# Patient Record
Sex: Female | Born: 1950 | Race: White | Hispanic: No | Marital: Married | Smoking: Never smoker
Health system: Southern US, Community
[De-identification: ages and names within clinical notes are randomized; demographics above are authoritative.]

## PROBLEM LIST (undated history)

## (undated) DIAGNOSIS — F419 Anxiety disorder, unspecified: Secondary | ICD-10-CM

## (undated) DIAGNOSIS — T7840XA Allergy, unspecified, initial encounter: Secondary | ICD-10-CM

## (undated) DIAGNOSIS — B351 Tinea unguium: Secondary | ICD-10-CM

## (undated) DIAGNOSIS — N951 Menopausal and female climacteric states: Secondary | ICD-10-CM

## (undated) DIAGNOSIS — I10 Essential (primary) hypertension: Secondary | ICD-10-CM

## (undated) HISTORY — DX: Allergy, unspecified, initial encounter: T78.40XA

## (undated) HISTORY — DX: Anxiety disorder, unspecified: F41.9

## (undated) HISTORY — DX: Menopausal and female climacteric states: N95.1

## (undated) HISTORY — PX: DILATION AND CURETTAGE OF UTERUS: SHX78

## (undated) HISTORY — PX: OTHER SURGICAL HISTORY: SHX169

## (undated) HISTORY — DX: Tinea unguium: B35.1

## (undated) HISTORY — DX: Essential (primary) hypertension: I10

## (undated) HISTORY — PX: EXPLORATORY LAPAROTOMY: SUR591

## (undated) HISTORY — PX: WISDOM TOOTH EXTRACTION: SHX21

---

## 2001-03-02 ENCOUNTER — Other Ambulatory Visit: Admission: RE | Admit: 2001-03-02 | Discharge: 2001-03-02 | Payer: Self-pay | Admitting: Obstetrics and Gynecology

## 2001-04-27 ENCOUNTER — Encounter (INDEPENDENT_AMBULATORY_CARE_PROVIDER_SITE_OTHER): Payer: Self-pay | Admitting: Specialist

## 2001-04-27 ENCOUNTER — Ambulatory Visit (HOSPITAL_COMMUNITY): Admission: RE | Admit: 2001-04-27 | Discharge: 2001-04-27 | Payer: Self-pay | Admitting: Obstetrics and Gynecology

## 2009-03-06 ENCOUNTER — Ambulatory Visit: Payer: Self-pay | Admitting: Family Medicine

## 2009-03-06 DIAGNOSIS — S838X9A Sprain of other specified parts of unspecified knee, initial encounter: Secondary | ICD-10-CM

## 2009-03-06 DIAGNOSIS — S86819A Strain of other muscle(s) and tendon(s) at lower leg level, unspecified leg, initial encounter: Secondary | ICD-10-CM

## 2009-03-16 ENCOUNTER — Ambulatory Visit: Payer: Self-pay | Admitting: Family Medicine

## 2009-03-16 DIAGNOSIS — R5383 Other fatigue: Secondary | ICD-10-CM

## 2009-03-16 DIAGNOSIS — IMO0001 Reserved for inherently not codable concepts without codable children: Secondary | ICD-10-CM

## 2009-03-16 DIAGNOSIS — I10 Essential (primary) hypertension: Secondary | ICD-10-CM | POA: Insufficient documentation

## 2009-03-16 DIAGNOSIS — R5381 Other malaise: Secondary | ICD-10-CM

## 2009-03-18 ENCOUNTER — Encounter (INDEPENDENT_AMBULATORY_CARE_PROVIDER_SITE_OTHER): Payer: Self-pay | Admitting: *Deleted

## 2009-03-18 LAB — CONVERTED CEMR LAB
Basophils Relative: 1 % (ref 0–1)
Eosinophils Absolute: 0.1 10*3/uL (ref 0.0–0.7)
MCHC: 31.8 g/dL (ref 30.0–36.0)
Monocytes Relative: 8 % (ref 3–12)
Neutro Abs: 4.3 10*3/uL (ref 1.7–7.7)
Neutrophils Relative %: 67 % (ref 43–77)
Platelets: 230 10*3/uL (ref 150–400)
RBC: 4.77 M/uL (ref 3.87–5.11)
Rhuematoid fact SerPl-aCnc: <20 intl units/mL (ref 0–20)
Sed Rate: 23 mm/hr — ABNORMAL HIGH (ref 0–22)
WBC: 6.5 10*3/uL (ref 4.0–10.5)

## 2009-08-10 ENCOUNTER — Ambulatory Visit: Payer: Self-pay | Admitting: Family Medicine

## 2009-08-10 ENCOUNTER — Other Ambulatory Visit: Admission: RE | Admit: 2009-08-10 | Discharge: 2009-08-10 | Payer: Self-pay | Admitting: Family Medicine

## 2009-08-10 DIAGNOSIS — Z780 Asymptomatic menopausal state: Secondary | ICD-10-CM | POA: Insufficient documentation

## 2009-08-10 LAB — CONVERTED CEMR LAB: Pap Smear: NORMAL

## 2010-01-01 ENCOUNTER — Ambulatory Visit: Payer: Self-pay | Admitting: Family Medicine

## 2010-01-01 DIAGNOSIS — B354 Tinea corporis: Secondary | ICD-10-CM | POA: Insufficient documentation

## 2010-02-23 NOTE — Assessment & Plan Note (Signed)
Summary: ringworm, HTN   Vital Signs:  Patient profile:   60 year old female Menstrual status:  postmenopausal Height:      69 inches Weight:      175 pounds BMI:     25.94 O2 Sat:      96 % on Room air Temp:     98.0 degrees F oral Pulse rate:   79 / minute BP sitting:   159 / 85  (left arm) Cuff size:   regular  Vitals Entered By: Payton Spark CMA (January 01, 2010 1:19 PM)  O2 Flow:  Room air CC: ? ring worm L shin x 10 days.   Primary Care Provider:  Seymour Bars DO  CC:  ? ring worm L shin x 10 days.Marland Kitchen  History of Present Illness: 60 yo WF presents for a rash over over the L anterior shin  10 days.  It is not itchy or painfull.  She tried some topical neosporin but it did not change. She has a dog.  o/w feels well.  Start on Metoprolol a year ago for HTN and social anxiety and it has really helped her social phobias.  Her BP has remained elevated though.  Denies CP, DOE or leg edema or palpitations.  Current Medications (verified): 1)  Metoprolol Succinate 50 Mg Xr24h-Tab (Metoprolol Succinate) .Marland Kitchen.. 1 Tab By Mouth Qam  Allergies (verified): No Known Drug Allergies  Past History:  Past Medical History: Reviewed history from 08/10/2009 and no changes required. menopause at 41   Past Surgical History: Reviewed history from 03/06/2009 and no changes required. bone spur D&C ex lap  Social History: Reviewed history from 03/06/2009 and no changes required. 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 Married to [place].  Daughter Yvonna Alanis.  Review of Systems      See HPI  Physical Exam  General:  alert, well-developed, well-nourished, and well-hydrated.   Skin:  1.6 annular pink scaley lesion over the L anterior lower leg with raised borders.     Impression & Recommendations:  Problem # 1:  TINEA CORPORIS (ICD-110.5) Assessment New Treat with topical Lamisil cream x 2 wks.   Call if not improving.  Disgard old  razor.  Problem # 2:  ESSENTIAL HYPERTENSION, BENIGN (ICD-401.1) Assessment: Deteriorated Add HCTZ to Metoprolol daily.  RTC in 3 mos and plan to update her BMP then. Her updated medication list for this problem includes:    Metoprolol Succinate 50 Mg Xr24h-tab (Metoprolol succinate) .Marland Kitchen... 1 tab by mouth qam    Hydrochlorothiazide 25 Mg Tabs (Hydrochlorothiazide) .Marland Kitchen... 1 tab by mouth qam  BP today: 159/85 Prior BP: 142/89 (08/10/2009)  Complete Medication List: 1)  Metoprolol Succinate 50 Mg Xr24h-tab (Metoprolol succinate) .Marland Kitchen.. 1 tab by mouth qam 2)  Terbinafine Hcl 1 % Crea (Terbinafine hcl) .... Apply to rash two times a day x 2 wks. 3)  Hydrochlorothiazide 25 Mg Tabs (Hydrochlorothiazide) .Marland Kitchen.. 1 tab by mouth qam  Patient Instructions: 1)  Stay on Metoprolol daily and add HCTZ 25 mg once daily for BP reduction. 2)  Use Terbinafine topical on ringworm rash. 3)  Return for f/u BP in 3 mos. Prescriptions: HYDROCHLOROTHIAZIDE 25 MG TABS (HYDROCHLOROTHIAZIDE) 1 tab by mouth qAM  #30 x 2   Entered and Authorized by:   Seymour Bars DO   Signed by:   Seymour Bars DO on 01/01/2010   Method used:   Electronically to  [facility] [address] [phone]* (retail)       [address]       [place], [place]  [address]       Ph: [phone] or [phone]       Fax: [phone]   RxID:   [id number] TERBINAFINE HCL 1 % CREA (TERBINAFINE HCL) apply to rash two times a day x 2 wks.  #1 tube x 2   Entered and Authorized by:   Seymour Bars DO   Signed by:   Seymour Bars DO on 01/01/2010   Method used:   Electronically to        [facility]. [address] [phone]* (retail)       [address]       [place], [place]  [address]       Ph: [phone] or [phone]       Fax: [phone]   RxID:   [id number]    Orders Added: 1)  Est. Patient Level IV [706[address]]

## 2010-02-23 NOTE — Letter (Signed)
Summary: Primary Care Consult Scheduled Letter  [place] [facility]  [address]   [place], [place] [address]   Phone: [phone]  Fax: [phone]      03/18/2009 MRN: [id number]  [facility] [address] [place], [place]  [address]    Dear Ms. Kuhl,      We have scheduled an appointment for you.  At the recommendation of Dr.BOWEN , we have scheduled you a consult with DR Ancil Linsey ,RHEUMATOLOGY  on JULY 12,2011 at 3:30PM .  Their address is_[address], [place]   [address]. The office phone number is [phone] .  If this appointment day and time is not convenient for you, please feel free to call the office of the doctor you are being referred to at the number listed above and reschedule the appointment.     It is important for you to keep your scheduled appointments. We are here to make sure you are given good patient care. If you have questions or you have made changes to your appointment, please notify us at  [phone], ask for HELEN.    Thank you,  Patient Care Coordinator [place] [facility]

## 2010-02-23 NOTE — Assessment & Plan Note (Signed)
Summary: CPE with pap   Vital Signs:  Patient profile:   60 year old female Menstrual status:  postmenopausal Height:      69 inches Weight:      173 pounds BMI:     25.64 O2 Sat:      97 % on Room air Pulse rate:   60 / minute BP sitting:   142 / 89  (left arm) Cuff size:   regular  Vitals Entered By: Payton Spark CMA (August 10, 2009 4:14 PM)  O2 Flow:  Room air CC: CPE w/ pap     Menstrual Status postmenopausal   Primary Care Provider:  Seymour Bars DO  CC:  CPE w/ pap.  History of Present Illness: 60 yo WF presents for CPE with pap smear.  She is doing well.  Due for pap smear, mammogram, DEXA scan.  She is due for her Tetanus shot and fasting labs.  She has continued to put off her initial screening colonoscopy.  Postmenopausal x 7 yrs, not on HRT.  Monogamous with husband.  Feels well.  BP has been well controlled on Metoprolol.  Due for RF.  Denies abnormal pap smears in the past.  Denies postmenopausal bleeding.  Denies fam hx of premature heart dz.  Current Medications (verified): 1)  Metoprolol Succinate 50 Mg Xr24h-Tab (Metoprolol Succinate) .Marland Kitchen.. 1 Tab By Mouth Qam  Allergies (verified): No Known Drug Allergies  Past History:  Past Medical History: menopause at 80   Past Surgical History: Reviewed history from 03/06/2009 and no changes required. bone spur D&C ex lap  Family History: Reviewed history from 03/06/2009 and no changes required. father died in his 75s from lung cancer  mother died of COPD brother died at 44 from colon cancer sister healthy  Social History: Reviewed history from 03/06/2009 and no changes required. 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 Married to [place].  Daughter Yvonna Alanis.  Review of Systems  The patient denies anorexia, fever, weight loss, weight gain, vision loss, decreased hearing, hoarseness, chest pain, syncope, dyspnea on exertion, peripheral edema, prolonged cough,  headaches, hemoptysis, abdominal pain, melena, hematochezia, severe indigestion/heartburn, hematuria, incontinence, genital sores, muscle weakness, suspicious skin lesions, transient blindness, difficulty walking, depression, unusual weight change, abnormal bleeding, enlarged lymph nodes, angioedema, breast masses, and testicular masses.    Physical Exam  General:  alert, well-developed, well-nourished, and well-hydrated.   Head:  normocephalic and atraumatic.   Eyes:  pupils equal, pupils round, and pupils reactive to light.   Ears:  EACs patent; TMs translucent and gray with good cone of light and bony landmarks.  Nose:  no nasal discharge.   Mouth:  good dentition and pharynx pink and moist.   Neck:  no masses.   Breasts:  No mass, nodules, thickening, tenderness, bulging, retraction, inflamation, nipple discharge or skin changes noted.   Lungs:  Normal respiratory effort, chest expands symmetrically. Lungs are clear to auscultation, no crackles or wheezes. Heart:  Normal rate and regular rhythm. S1 and S2 normal without gallop, murmur, click, rub or other extra sounds. Abdomen:  Bowel sounds positive,abdomen soft and non-tender without masses, organomegaly or hernias noted. Genitalia:  Pelvic Exam:        External: normal female genitalia without lesions or masses        Vagina: normal without lesions or masses        Cervix: normal without lesions or masses  Adnexa: normal bimanual exam without masses or fullness        Uterus: normal by palpation        Pap smear: performed Pulses:  2+ radial and pedal pulses Extremities:  no E/C/C Skin:  color normal.   Cervical Nodes:  No lymphadenopathy noted Psych:  good eye contact, not anxious appearing, and not depressed appearing.     Impression & Recommendations:  Problem # 1:  ROUTINE GYNECOLOGICAL EXAMINATION (ICD-V72.31) Thin prep pap smear done.   Update mammogram and DEXA scan. Update fasting labs and Tdap. RF BP  meds. Work on diet, exercise, MVI and calcium/ D daily. Declined colonoscopy, will call when ready to schedule.  Complete Medication List: 1)  Metoprolol Succinate 50 Mg Xr24h-tab (Metoprolol succinate) .Marland Kitchen.. 1 tab by mouth qam  Other Orders: T-Comprehensive Metabolic Panel [phone]) T-Lipid Profile [phone]) T-Mammography Bilateral Screening ([address]) T-DXA Bone Density/ Appendicular (13086) T-Dual DXA Bone Density/ Axial (57846) Tdap => 70yrs IM ([address]) Admin 1st Vaccine (284[address]) Admin 1st Vaccine [facility]) [phone])  Patient Instructions: 1)  Return for a nurse BP check in 4 wks. Prescriptions: METOPROLOL SUCCINATE 50 MG XR24H-TAB (METOPROLOL SUCCINATE) 1 tab by mouth qAM  #30 x 5   Entered and Authorized by:   Seymour Bars DO   Signed by:   Seymour Bars DO on 08/10/2009   Method used:   Electronically to        [facility]. [address] [phone]* (retail)       [address]       [place], [place]  [address]       Ph: [phone] or [phone]       Fax: [phone]   RxID:   [id number]    Tetanus/Td Vaccine    Vaccine Type: Tdap    Site: right deltoid    Dose: 0.5 ml    Route: IM    Given by: Payton Spark CMA    Exp. Date: 04/18/2011    Lot #: [id number]    VIS given: 12/12/06 version given August 10, 2009.

## 2010-02-23 NOTE — Assessment & Plan Note (Signed)
Summary: NOV hamstring strain   Vital Signs:  Patient profile:   60 year old female Height:      69 inches Weight:      166 pounds BMI:     24.60 O2 Sat:      97 % on Room air Temp:     98.4 degrees F oral Pulse rate:   89 / minute BP sitting:   163 / 88  (right arm) Cuff size:   regular  Vitals Entered By: Payton Spark CMA/April (March 06, 2009 3:05 PM)  O2 Flow:  Room air CC: c/o pulled right hamstring    Primary Care Provider:  Seymour Bars DO  CC:  c/o pulled right hamstring .  History of Present Illness: 60 yo WF presents for pain in the R hamstring and buttocks that started about 7 wks ago.  Denies any trauma or overuse injury.  No hx of pain in this region.  Rated as moderate intensity.  She went to [facility] 2 wks ago.  She took Amrix x 6 days.  She has been taking Aleve and using [facility] which helps.  She sits mostly at work.  Her pain is better with sitting.  Pain is worse going up and down stairs and worse with lifting esp on the R side.    She is able to get comfortable to sleep.  She is doing a light workout but usually does the elliptical.  Pain radiates to the knees.  No weakness or paresthesias.  No LBP.  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 Married to [place].  Daughter Yvonna Alanis.  Review of Systems       no fevers/sweats/weakness, unexplained wt loss/gain, no change in vision, no difficulty hearing, ringing in ears, no hay fever/allergies, no CP/discomfort, no palpitations, no breast lump/nipple discharge, no cough/wheeze, no blood in stool, no N/V/D, no nocturia, no leaking  urine, no unusual vag bleeding, no vaginal/penile discharge, no muscle/joint pain, no rash, no new/changing mole, no HA, no memory loss, no anxiety, no sleep problem, no depression, no unexplained lumps, no easy bruising/bleeding, no concern with sexual function   Physical Exam  General:  alert, well-developed, well-nourished, and well-hydrated.   Head:  normocephalic and atraumatic.   Mouth:  good dentition and pharynx pink and moist.   Neck:  supple, full ROM, and no masses.   Lungs:  Normal respiratory effort, chest expands symmetrically. Lungs are clear to auscultation, no crackles or wheezes. Heart:  Normal rate and regular rhythm. S1 and S2 normal without gallop, murmur, click, rub or other extra sounds. Abdomen:  soft and non-tender.   Msk:  full active L spine ROM normal heel toe walk tender over the R SI notch and piriformis.  full resisted strength over both hamstrings with R>L tightness. No muscle defects. Pulses:  2+ pedal pulses Extremities:  no LE edema Neurologic:  gait normal and DTRs symmetrical and normal.   Skin:  color normal.   Psych:  good eye contact, not anxious appearing, and not depressed appearing.     Impression & Recommendations:  Problem # 1:  MUSCLE STRAIN, HAMSTRING MUSCLE (ICD-844.8) 7 wks of hamstring pain due to muscle strain with possible sacroilitis also. Start formal PT.  Use Mobic as RX NSAID.  Use heat/ icy hot. RTC to recheck with CPE in 1 month.  Needs colonoscopy. Orders: Physical Therapy Referral (PT)  Complete Medication List: 1)  Meloxicam 7.5 Mg Tabs (Meloxicam) .Marland Kitchen.. 1-2 tab by mouth daily with food as needed for hamstring pain  Patient Instructions: 1)  Take Meloxicam instead of Aleve as your anti inflammatory. 2)  Will set you up for PT down the hall. 3)  ICY hot/ heating pad fine to use. 4)  Return for CPE with pap smear in 1 month. Prescriptions: MELOXICAM 7.5 MG TABS (MELOXICAM) 1-2 tab by mouth daily with food as needed for  hamstring pain  #60 x 0   Entered and Authorized by:   Seymour Bars DO   Signed by:   Seymour Bars DO on 03/06/2009   Method used:   Electronically to        [facility]. [address] [phone]* (retail)       [address]       [place], [place]  [address]       Ph: [phone] or [phone]       Fax: [phone]   RxID:   [id number]

## 2010-02-23 NOTE — Assessment & Plan Note (Signed)
Summary: myalgias   Vital Signs:  Patient profile:   60 year old female Height:      69 inches Weight:      165 pounds BMI:     24.45 O2 Sat:      97 % on Room air Temp:     99.0 degrees F oral Pulse rate:   84 / minute BP sitting:   191 / 96  (left arm) Cuff size:   regular  Vitals Entered By: Payton Spark CMA (March 16, 2009 1:28 PM)  O2 Flow:  Room air CC: Muscle aches in arms and legs. Also c/o fatigue   Primary Care Provider:  Seymour Bars DO  CC:  Muscle aches in arms and legs. Also c/o fatigue.  History of Present Illness: 60 yo WF presents for problems with muscle aches in the arms and legs x 1 wk.  'Feels like I have the flu' but denies any sore throat, cough, congestion or GI upset, fevers or chills.  Getting worse.  Worse in the morning.  No hx of recent illness.  In October 2010, she was sick with nausea and dizziness.   That cleared up and she had no myalgias.  She is off all RX and OTC meds.  She is still going to work.  She denies feeling tired.  Denies joint pains.  She is sleeping well at night.    No hx of statin use.  Denies feeling anxious or depressed.  Denies any acute stresors.    Current Medications (verified): 1)  Meloxicam 7.5 Mg Tabs (Meloxicam) .Marland Kitchen.. 1-2 Tab By Mouth Daily With Food As Needed For Hamstring Pain  Allergies (verified): No Known Drug Allergies  Past History:  Past Medical History: Reviewed history from 03/06/2009 and no changes required. none  Past Surgical History: Reviewed history from 03/06/2009 and no changes required. bone spur D&C ex lap  Family History: Reviewed history from 03/06/2009 and no changes required. 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 Married to [place].  Daughter  Yvonna Alanis.  Review of Systems      See HPI  Physical Exam  General:  alert, well-developed, well-nourished, and well-hydrated.   Head:  normocephalic and atraumatic.   Eyes:  conjunctiva clear; wears glasses Nose:  no nasal discharge.   Mouth:  good dentition and pharynx pink and moist.   Neck:  no masses.   Lungs:  Normal respiratory effort, chest expands symmetrically. Lungs are clear to auscultation, no crackles or wheezes. Heart:  Normal rate and regular rhythm. S1 and S2 normal without gallop, murmur, click, rub or other extra sounds. Abdomen:  soft, non-tender, no distention, and no guarding.   Msk:  no joint tenderness, no joint swelling, no joint warmth, and no redness over joints.  slightly tender over the upper arms and thighs - right in the middle of the muscle belly mild synovitis over the DIP joints, both hands Extremities:  no E/C/C Skin:  color normal and no rashes.   Cervical Nodes:  No lymphadenopathy noted Psych:  good eye contact, not depressed appearing, and slightly anxious.     Impression & Recommendations:  Problem # 1:  MYALGIA (ICD-729.1) Check labs today to look for underlying cause of her myalgias.  We discussed  the possiblity of fibromyalgia.  Will try her on Flexeril at bedtime and f/u lab tomorrow.   The following medications were removed from the medication list:    Meloxicam 7.5 Mg Tabs (Meloxicam) .Marland Kitchen... 1-2 tab by mouth daily with food as needed for hamstring pain Her updated medication list for this problem includes:    Flexeril 5 Mg Tabs (Cyclobenzaprine hcl) .Marland Kitchen... 1 tab by mouth at bedtime as needed muscle aches  Orders: T-CK Total [phone]) T-CBC w/Diff ([id number]) T-Rheumatoid Factor [id number]) T-Sed Rate (Automated) ([address]41-32440) T-Antinuclear Antib (ANA) ([id number])  Problem # 2:  ESSENTIAL HYPERTENSION, BENIGN (ICD-401.1) Assessment: New BP very high today.  Will start a BB for BP reduction and related social anxiety.  F/U in  1 month. Her updated medication list for this problem includes:    Metoprolol Succinate 50 Mg Xr24h-tab (Metoprolol succinate) .Marland Kitchen... 1 tab by mouth qam  BP today: 191/96 Prior BP: 163/88 (03/06/2009)  Complete Medication List: 1)  Metoprolol Succinate 50 Mg Xr24h-tab (Metoprolol succinate) .Marland Kitchen.. 1 tab by mouth qam 2)  Flexeril 5 Mg Tabs (Cyclobenzaprine hcl) .Marland Kitchen.. 1 tab by mouth at bedtime as needed muscle aches  Other Orders: T-TSH ([id number])  Patient Instructions: 1)  Labs today downstairs. 2)  Will call you w/ results tomorrow.   3)  Start on Metroprolol 50 mg every AM for high BP. 4)  Start Flexeril at bedtime for myalgias. 5)  Return for f/u in 3 wks. Prescriptions: FLEXERIL 5 MG TABS (CYCLOBENZAPRINE HCL) 1 tab by mouth at bedtime as needed muscle aches  #30 x 0   Entered and Authorized by:   Seymour Bars DO   Signed by:   Seymour Bars DO on 03/16/2009   Method used:   Electronically to        [facility]. [address] [phone]* (retail)       [address]       [place], [place]  [address]       Ph: [phone] or [phone]       Fax: [phone]   RxID:   [id number] METOPROLOL SUCCINATE 50 MG XR24H-TAB (METOPROLOL SUCCINATE) 1 tab by mouth qAM  #30 x 2   Entered and Authorized by:   Seymour Bars DO   Signed by:   Seymour Bars DO on 03/16/2009   Method used:   Electronically to        [facility]. [address] [phone]* (retail)       [address]       [place], [place]  [address]       Ph: [phone] or [phone]       Fax: [phone]   RxID:   [id number]

## 2010-03-10 ENCOUNTER — Telehealth (INDEPENDENT_AMBULATORY_CARE_PROVIDER_SITE_OTHER): Payer: Self-pay | Admitting: *Deleted

## 2010-03-17 NOTE — Progress Notes (Signed)
Summary: [facility]-Metoprolol Refill  Phone Note Call from Patient Call back at Work Phone [phone]   Caller: Patient Call For: Seymour Bars DO Reason for Call: Refill Medication Summary of Call: needs refill on Metoprolol, would like it to be sent to [facility] [place]. Initial call taken by: Francee Piccolo CMA Duncan Dull),  March 10, 2010 2:49 PM    Prescriptions: METOPROLOL SUCCINATE 50 MG XR24H-TAB (METOPROLOL SUCCINATE) 1 tab by mouth qAM  #30 x 2   Entered by:   Payton Spark CMA   Authorized by:   Seymour Bars DO   Signed by:   Payton Spark CMA on 03/10/2010   Method used:   [facility] [address] [phone]* (retail)       [address]       [place], [place]  [address]       Ph: [phone] or [phone]       Fax: [phone]   RxID:   [id number]

## 2010-04-19 ENCOUNTER — Other Ambulatory Visit: Payer: Self-pay | Admitting: *Deleted

## 2010-04-19 MED ORDER — HYDROCHLOROTHIAZIDE 25 MG PO TABS: 25.0000 mg | ORAL_TABLET | Freq: Every day | ORAL | Status: DC

## 2010-06-03 ENCOUNTER — Other Ambulatory Visit: Payer: Self-pay | Admitting: *Deleted

## 2010-06-03 MED ORDER — METOPROLOL SUCCINATE ER 50 MG PO TB24: 50.0000 mg | ORAL_TABLET | Freq: Every day | ORAL | Status: DC

## 2010-06-11 ENCOUNTER — Other Ambulatory Visit: Payer: Self-pay | Admitting: Family Medicine

## 2010-06-11 NOTE — Op Note (Signed)
[facility]  Patient:    Stephanie Cross, Stephanie Cross Visit Number: [id number] MRN: [id number]          Service Type: DSU Location: DAY Attending Physician:  Michaele Offer Dictated by:   Zenaida Niece, M.D. Proc. Date: 04/27/01 Admit Date:  04/27/2001                             Operative Report  PREOPERATIVE DIAGNOSIS:  Abnormal uterine bleeding.  POSTOPERATIVE DIAGNOSIS:  Abnormal uterine bleeding.  PROCEDURE:  Hysteroscopy with dilation and curettage.  SURGEON:  Zenaida Niece, M.D.  ANESTHESIA:  Monitored anesthesia care and paracervical block.  ESTIMATED BLOOD LOSS:  Less than 50 cc. Deficit through the hysteroscope was approximately 60 cc.  FINDINGS:  Small endometrial cavity with a small amount of tissue at the left uterine cornu. The endometrium was otherwise atrophic without significant polyp or leiomyoma.  DESCRIPTION OF PROCEDURE:  The patient was taken to the operating room and placed in the dorsal supine position. She was given IV sedation and then placed in mobile stirrups. The perineum and vagina were prepped and draped in the usual sterile fashion and her bladder drained with a red rubber catheter. A bivalve speculum was inserted into the vagina and the anterior lip of the cervix grasped with a single tooth tenaculum. A paracervical block was then performed with 2% lidocaine. The uterus then sounded to approximately 7-8 cm. The cervix was gradually dilated to a size 21 dilator with moderate resistance. The observer scope was introduced and inspection revealed the above mentioned findings. The only significant finding was a small amount of tissue at the left cornu. The scope was removed and sharp curettage was performed trying to loosen the tissue in the left cornu. Polyp forceps were used to try and remove this tissue. Observation with the hysteroscope then revealed no significant bleeding, still no lesions and a small  amount of tissue remaining in the uterus. The scope was removed and polyp forceps and curette were used try and remove the remainder of this tissue. The single tooth tenaculum was then removed and bleeding controlled with pressure. All instruments were then removed from the vagina. The patient was awakened in the operating room and tolerated the procedure well and taken to the recovery room in stable condition. Dictated by:   Zenaida Niece, M.D. Attending Physician:  Michaele Offer DD:  04/27/01 TD:  04/27/01 Job: [id number]

## 2010-07-07 ENCOUNTER — Other Ambulatory Visit: Payer: Self-pay | Admitting: *Deleted

## 2010-07-07 MED ORDER — HYDROCHLOROTHIAZIDE 25 MG PO TABS: 25.0000 mg | ORAL_TABLET | Freq: Every day | ORAL | Status: DC

## 2010-11-22 ENCOUNTER — Other Ambulatory Visit: Payer: Self-pay | Admitting: *Deleted

## 2010-11-22 MED ORDER — HYDROCHLOROTHIAZIDE 25 MG PO TABS: 25.0000 mg | ORAL_TABLET | Freq: Every day | ORAL | Status: DC

## 2010-11-25 ENCOUNTER — Encounter: Payer: Self-pay | Admitting: Family Medicine

## 2010-12-03 ENCOUNTER — Ambulatory Visit: Payer: Self-pay | Admitting: Family Medicine

## 2010-12-14 ENCOUNTER — Encounter: Payer: Self-pay | Admitting: Family Medicine

## 2010-12-14 ENCOUNTER — Ambulatory Visit (INDEPENDENT_AMBULATORY_CARE_PROVIDER_SITE_OTHER): Payer: BC Managed Care – PPO | Admitting: Family Medicine

## 2010-12-14 VITALS — BP 137/74 | HR 93 | Wt 173.0 lb

## 2010-12-14 DIAGNOSIS — I10 Essential (primary) hypertension: Secondary | ICD-10-CM

## 2010-12-14 DIAGNOSIS — J329 Chronic sinusitis, unspecified: Secondary | ICD-10-CM

## 2010-12-14 DIAGNOSIS — Z1211 Encounter for screening for malignant neoplasm of colon: Secondary | ICD-10-CM

## 2010-12-14 MED ORDER — HYDROCHLOROTHIAZIDE 25 MG PO TABS: 25.0000 mg | ORAL_TABLET | Freq: Every day | ORAL | Status: DC

## 2010-12-14 MED ORDER — ALBUTEROL SULFATE HFA 108 (90 BASE) MCG/ACT IN AERS: 2.0000 | INHALATION_SPRAY | Freq: Four times a day (QID) | RESPIRATORY_TRACT | Status: DC | PRN

## 2010-12-14 MED ORDER — METOPROLOL SUCCINATE ER 50 MG PO TB24: 50.0000 mg | ORAL_TABLET | Freq: Every day | ORAL | Status: DC

## 2010-12-14 MED ORDER — AMOXICILLIN 875 MG PO TABS: 875.0000 mg | ORAL_TABLET | Freq: Two times a day (BID) | ORAL | Status: AC

## 2010-12-14 NOTE — Patient Instructions (Signed)
Call if not better in one week.  We will call you with your lab results. If you don't here from [place] in about a week then please give [place] a call at [phone].

## 2010-12-14 NOTE — Progress Notes (Signed)
  Subjective:    Patient ID: Stephanie Cross, female    DOB: 09-Nov-1950, 60 y.o.   MRN: [id number]  Hypertension This is a chronic problem. The current episode started more than 1 year ago. The problem is controlled. Pertinent negatives include no blurred vision, chest pain, headaches or shortness of breath. There are no associated agents to hypertension. Past treatments include diuretics and beta blockers. The current treatment provides moderate improvement. There are no compliance problems.    Cough and postnasal drip x 2 weeks. Has been using her inhaler.  Needs RF. Some dry and some production.  No fever. Started with mild ST last week and htat is better. Now some mild nasal congestion.    Review of Systems  Eyes: Negative for blurred vision.  Respiratory: Negative for shortness of breath.   Cardiovascular: Negative for chest pain.  Neurological: Negative for headaches.       Objective:   Physical Exam  Constitutional: She is oriented to person, place, and time. She appears well-developed and well-nourished.  HENT:  Head: Normocephalic and atraumatic.  Neck: Neck supple. No thyromegaly present.  Cardiovascular: Normal rate, regular rhythm and normal heart sounds.        No carotid bruits  Pulmonary/Chest: Effort normal and breath sounds normal.  Lymphadenopathy:    She has no cervical adenopathy.  Neurological: She is alert and oriented to person, place, and time.  Skin: Skin is warm and dry.  Psychiatric: She has a normal mood and affect. Her behavior is normal.          Assessment & Plan:  HTN -  she she is well controlled. I will refill her medications. Followup in 6 months. She is also due for CMP and lipid panel. Lab slips were given today. It has been a year since her last set of blood work.  Sinusitis - I still think this could be viral. I encouraged her to get a couple more days to see if she improves. If she suddenly feels worse or is not improving by the weekend  she can go ahead and fill the prescription for amoxicillin. If she does so prescription and she's not better in one week then please call the office. She can continue over-the-counter symptomatic care.  We also had discussion about the importance of screening for colon cancer. Patient has never had a screening colonoscopy and she is 23. She agreed to get her scheduled and I encouraged her to keep the appointment. She is also due for a screening mammogram but wanted to hold off on this until in a year.

## 2011-02-15 LAB — LIPID PANEL: HDL: 52 mg/dL (ref >39)

## 2011-02-15 LAB — COMPLETE METABOLIC PANEL WITH GFR
Albumin: 4.6 g/dL (ref 3.5–5.2)
CO2: 28 mEq/L (ref 19–32)
Calcium: 9.4 mg/dL (ref 8.4–10.5)
GFR, Est African American: 66 mL/min
GFR, Est Non African American: 58 mL/min — ABNORMAL LOW
Glucose, Bld: 96 mg/dL (ref 70–99)
Sodium: 143 mEq/L (ref 135–145)
Total Bilirubin: 0.6 mg/dL (ref 0.3–1.2)
Total Protein: 6.6 g/dL (ref 6.0–8.3)

## 2011-03-10 ENCOUNTER — Encounter: Payer: Self-pay | Admitting: Gastroenterology

## 2011-06-14 ENCOUNTER — Other Ambulatory Visit: Payer: Self-pay | Admitting: Family Medicine

## 2011-09-09 ENCOUNTER — Other Ambulatory Visit: Payer: Self-pay | Admitting: Family Medicine

## 2011-12-14 ENCOUNTER — Other Ambulatory Visit: Payer: Self-pay | Admitting: Family Medicine

## 2011-12-14 NOTE — Telephone Encounter (Signed)
Must make appt before any further refills. 

## 2012-01-12 ENCOUNTER — Other Ambulatory Visit: Payer: Self-pay | Admitting: *Deleted

## 2012-01-12 MED ORDER — HYDROCHLOROTHIAZIDE 25 MG PO TABS: 25.0000 mg | ORAL_TABLET | Freq: Every day | ORAL | Status: DC

## 2012-01-12 MED ORDER — METOPROLOL SUCCINATE ER 50 MG PO TB24: 50.0000 mg | ORAL_TABLET | Freq: Every day | ORAL | Status: DC

## 2012-01-13 ENCOUNTER — Ambulatory Visit: Payer: BC Managed Care – PPO | Admitting: Family Medicine

## 2012-01-24 ENCOUNTER — Ambulatory Visit (INDEPENDENT_AMBULATORY_CARE_PROVIDER_SITE_OTHER): Payer: BC Managed Care – PPO | Admitting: Family Medicine

## 2012-01-24 ENCOUNTER — Encounter: Payer: Self-pay | Admitting: Family Medicine

## 2012-01-24 VITALS — BP 149/76 | HR 79 | Resp 14 | Ht 68.0 in | Wt 171.0 lb

## 2012-01-24 DIAGNOSIS — Z1231 Encounter for screening mammogram for malignant neoplasm of breast: Secondary | ICD-10-CM

## 2012-01-24 DIAGNOSIS — I10 Essential (primary) hypertension: Secondary | ICD-10-CM

## 2012-01-24 DIAGNOSIS — Z800 Family history of malignant neoplasm of digestive organs: Secondary | ICD-10-CM

## 2012-01-24 DIAGNOSIS — J45909 Unspecified asthma, uncomplicated: Secondary | ICD-10-CM | POA: Insufficient documentation

## 2012-01-24 DIAGNOSIS — J069 Acute upper respiratory infection, unspecified: Secondary | ICD-10-CM

## 2012-01-24 MED ORDER — METOPROLOL SUCCINATE ER 100 MG PO TB24: 100.0000 mg | ORAL_TABLET | Freq: Every day | ORAL | Status: DC

## 2012-01-24 MED ORDER — HYDROCHLOROTHIAZIDE 25 MG PO TABS: 25.0000 mg | ORAL_TABLET | Freq: Every day | ORAL | Status: DC

## 2012-01-24 MED ORDER — ALBUTEROL SULFATE HFA 108 (90 BASE) MCG/ACT IN AERS: 2.0000 | INHALATION_SPRAY | Freq: Four times a day (QID) | RESPIRATORY_TRACT | Status: DC | PRN

## 2012-01-24 NOTE — Progress Notes (Signed)
Subjective:    Patient ID: Stephanie Cross, female    DOB: 13-Feb-1950, 61 y.o.   MRN: [id number]  HPI HTN -  Pt denies chest pain, SOB, dizziness, or heart palpitations.  Taking meds as directed w/o problems.  Denies medication side effects.    Cold sxs for about 3 days ago.  Has been taking some OTC meds.  Coughing started yesterday. No fever. No GI sxs.  No SOB.  Has been using her inhaler some during the cold. Some mild nasal congestion this AM.    Review of Systems     BP 149/76  Pulse 79  Resp 14  Ht 5\' 8"  (1.727 m)  Wt 171 lb (77.565 kg)  BMI 26.00 kg/m2  SpO2 98%    No Known Allergies  Past Medical History  Diagnosis Date  . Menopause syndrome     Past Surgical History  Procedure Date  . Bone spur   . Dilation and curettage of uterus   . Exploratory laparotomy     History   Social History  . Marital Status: Single    Spouse Name: N/A    Number of Children: N/A  . Years of Education: N/A   Occupational History  . Not on file.   Social History Main Topics  . Smoking status: Never Smoker   . Smokeless tobacco: Not on file  . Alcohol Use: Yes     Comment: occasiona;  Marland Kitchen Drug Use:   . Sexually Active:      Comment: admin asst, walks on treadmill 4 X week, married.   Other Topics Concern  . Not on file   Social History Narrative  . No narrative on file    Family History  Problem Relation Age of Onset  . COPD Mother     smoker  . Lung cancer Father     smoker   . Colon cancer Brother     Outpatient Encounter Prescriptions as of 01/24/2012  Medication Sig Dispense Refill  . albuterol (PROAIR HFA) 108 (90 BASE) MCG/ACT inhaler Inhale 2 puffs into the lungs every 6 (six) hours as needed for wheezing or shortness of breath.  3 Inhaler  2  . hydrochlorothiazide (HYDRODIURIL) 25 MG tablet Take 1 tablet (25 mg total) by mouth daily.  90 tablet  1  . metoprolol succinate (TOPROL-XL) 100 MG 24 hr tablet Take 1 tablet (100 mg total) by mouth daily. Take  with or immediately following a meal.  90 tablet  0  . [DISCONTINUED] albuterol (PROAIR HFA) 108 (90 BASE) MCG/ACT inhaler Inhale 2 puffs into the lungs every 6 (six) hours as needed.  3 Inhaler  2  . [DISCONTINUED] hydrochlorothiazide (HYDRODIURIL) 25 MG tablet Take 1 tablet (25 mg total) by mouth daily.  20 tablet  0  . [DISCONTINUED] metoprolol succinate (TOPROL-XL) 50 MG 24 hr tablet Take 1 tablet (50 mg total) by mouth daily. Take with or immediately following a meal.  20 tablet  0       Objective:   Physical Exam  Constitutional: She is oriented to person, place, and time. She appears well-developed and well-nourished.  HENT:  Head: Normocephalic and atraumatic.  Right Ear: External ear normal.  Left Ear: External ear normal.  Nose: Nose normal.  Mouth/Throat: Oropharynx is clear and moist.       TMs and canals are clear.   Eyes: Conjunctivae normal and EOM are normal. Pupils are equal, round, and reactive to light.  Neck: Neck supple. No thyromegaly  present.  Cardiovascular: Normal rate, regular rhythm and normal heart sounds.   Pulmonary/Chest: Effort normal and breath sounds normal. She has no wheezes.  Lymphadenopathy:    She has no cervical adenopathy.  Neurological: She is alert and oriented to person, place, and time.  Skin: Skin is warm and dry.  Psychiatric: She has a normal mood and affect.          Assessment & Plan:  HTN - uncontrolle.d Increase metoprolol to 100mg . F/U in 3 months for repeat BP check.    URI - Cal if not better in one week.  Symptomatic care.  Discussed need for shingles vaccine. Handout given. She will think about it.  She is overdue for screening mammogram. Order placed today.  She has a positive family history of colon cancer we discussed the need for screening colonoscopy. She says she's nervous to do it. I discussed how important it is an outcome as a preventative if they're able to remove precancerous polyps. She says she will  schedule. I gave her the information for digestive health.  Asthma -doing well overall. Rarely uses her inhaler but would like a new prescription because her old ones are getting to expire.

## 2012-03-14 ENCOUNTER — Ambulatory Visit: Payer: BC Managed Care – PPO | Admitting: Family Medicine

## 2012-03-27 ENCOUNTER — Encounter: Payer: Self-pay | Admitting: Internal Medicine

## 2012-03-28 ENCOUNTER — Ambulatory Visit (INDEPENDENT_AMBULATORY_CARE_PROVIDER_SITE_OTHER): Payer: BC Managed Care – PPO

## 2012-03-28 ENCOUNTER — Ambulatory Visit (INDEPENDENT_AMBULATORY_CARE_PROVIDER_SITE_OTHER): Payer: BC Managed Care – PPO | Admitting: Family Medicine

## 2012-03-28 ENCOUNTER — Other Ambulatory Visit: Payer: Self-pay | Admitting: Family Medicine

## 2012-03-28 ENCOUNTER — Encounter: Payer: Self-pay | Admitting: Family Medicine

## 2012-03-28 VITALS — BP 134/74 | HR 69 | Ht 68.0 in | Wt 175.0 lb

## 2012-03-28 DIAGNOSIS — R05 Cough: Secondary | ICD-10-CM

## 2012-03-28 DIAGNOSIS — I10 Essential (primary) hypertension: Secondary | ICD-10-CM

## 2012-03-28 DIAGNOSIS — R059 Cough, unspecified: Secondary | ICD-10-CM

## 2012-03-28 NOTE — Progress Notes (Signed)
Subjective:    Patient ID: Stephanie Cross, female    DOB: 09-May-1950, 62 y.o.   MRN: [id number]  HPI Cough -= x 1 month pt has hx of bronchitis. she stated that she feels that its in her back and she coughs up greenish yellow phlem. she has used tylenol cough and cold did not help.  Started with cold sxs but sxs got better except the cough is lingering. She feels like her sinuses are congested as well. She is having occ coughing fits.  No SOB. No fever.  No ST.  No runny nose.  + post nasal drip.  occ thinks she has allergies during diff times of year. Says going outside sometine will trigger with cough.  She denies any heartburn or reflux symptoms. She has used her albuterol a couple times. She does like it's helping to move the mucous out of her chest.  HTN- follow up blood pressure. Increase metoprolol her last visit 3 months ago. She's tolerating it well without any side effects. No chest pain, shortness of breath, dizziness or lower extremity swelling. She is active.   Review of Systems BP 134/74  Pulse 69  Ht 5\' 8"  (1.727 m)  Wt 175 lb (79.379 kg)  BMI 26.61 kg/m2    No Known Allergies  Past Medical History  Diagnosis Date  . Menopause syndrome     Past Surgical History  Procedure Laterality Date  . Bone spur    . Dilation and curettage of uterus    . Exploratory laparotomy      History   Social History  . Marital Status: Single    Spouse Name: N/A    Number of Children: N/A  . Years of Education: N/A   Occupational History  . Not on file.   Social History Main Topics  . Smoking status: Never Smoker   . Smokeless tobacco: Not on file  . Alcohol Use: Yes     Comment: occasiona;  Marland Kitchen Drug Use:   . Sexually Active:      Comment: admin asst, walks on treadmill 4 X week, married.   Other Topics Concern  . Not on file   Social History Narrative  . No narrative on file    Family History  Problem Relation Age of Onset  . COPD Mother     smoker  . Lung cancer  Father     smoker   . Colon cancer Brother     Outpatient Encounter Prescriptions as of 03/28/2012  Medication Sig Dispense Refill  . albuterol (PROAIR HFA) 108 (90 BASE) MCG/ACT inhaler Inhale 2 puffs into the lungs every 6 (six) hours as needed for wheezing or shortness of breath.  3 Inhaler  2  . hydrochlorothiazide (HYDRODIURIL) 25 MG tablet Take 1 tablet (25 mg total) by mouth daily.  90 tablet  1  . metoprolol succinate (TOPROL-XL) 100 MG 24 hr tablet Take 1 tablet (100 mg total) by mouth daily. Take with or immediately following a meal.  90 tablet  0   No facility-administered encounter medications on file as of 03/28/2012.          Objective:   Physical Exam  Constitutional: She is oriented to person, place, and time. She appears well-developed and well-nourished.  HENT:  Head: Normocephalic and atraumatic.  Right Ear: External ear normal.  Left Ear: External ear normal.  Nose: Nose normal.  Mouth/Throat: Oropharynx is clear and moist.  TMs and canals are clear.   Eyes: Conjunctivae and  EOM are normal. Pupils are equal, round, and reactive to light.  Neck: Neck supple. No thyromegaly present.  Cardiovascular: Normal rate, regular rhythm and normal heart sounds.   Pulmonary/Chest: Effort normal and breath sounds normal. She has no wheezes.  Lymphadenopathy:    She has no cervical adenopathy.  Neurological: She is alert and oriented to person, place, and time.  Skin: Skin is warm and dry.  Psychiatric: She has a normal mood and affect.          Assessment & Plan:  Cough x 4 weeks.  Post viral cough is likely.  Will get CXR today to rule out any infectious cause or mass. She is not on any medications that should be causing her cough. Also consider postnasal drip coming from her sinus cavities. I did recommend she try over-the-counter Claritin or Allegra for at least a week to see if this also helps her symptoms and she does have a history of allergic rhinitis.Marland Kitchen     HTN-Well controlled. Looks great on increased his metoprolol. Continue current regimen. Followup in 6 months.

## 2012-03-28 NOTE — Patient Instructions (Addendum)
We will call you with your lab results. If you don't here from us in about a week then please give us a call at [phone].  

## 2012-03-30 LAB — BORDETELLA PERTUSSIS PCR: B parapertussis, DNA: NOT DETECTED

## 2012-04-18 LAB — CULTURE, BORDETELLA PERTUSSIS

## 2012-04-23 ENCOUNTER — Encounter: Payer: Self-pay | Admitting: Internal Medicine

## 2012-04-23 ENCOUNTER — Ambulatory Visit (AMBULATORY_SURGERY_CENTER): Payer: BC Managed Care – PPO

## 2012-04-23 VITALS — Ht 68.0 in | Wt 173.4 lb

## 2012-04-23 DIAGNOSIS — Z1211 Encounter for screening for malignant neoplasm of colon: Secondary | ICD-10-CM

## 2012-04-23 DIAGNOSIS — Z800 Family history of malignant neoplasm of digestive organs: Secondary | ICD-10-CM

## 2012-04-23 MED ORDER — MOVIPREP 100 G PO SOLR: ORAL | Status: DC

## 2012-05-01 ENCOUNTER — Other Ambulatory Visit: Payer: Self-pay | Admitting: Family Medicine

## 2012-05-10 ENCOUNTER — Encounter: Payer: Self-pay | Admitting: Internal Medicine

## 2012-05-10 ENCOUNTER — Ambulatory Visit (AMBULATORY_SURGERY_CENTER): Payer: BC Managed Care – PPO | Admitting: Internal Medicine

## 2012-05-10 VITALS — BP 135/63 | HR 53 | Temp 96.7°F | Resp 16 | Ht 68.0 in | Wt 173.0 lb

## 2012-05-10 DIAGNOSIS — Z1211 Encounter for screening for malignant neoplasm of colon: Secondary | ICD-10-CM

## 2012-05-10 DIAGNOSIS — Z800 Family history of malignant neoplasm of digestive organs: Secondary | ICD-10-CM

## 2012-05-10 MED ORDER — SODIUM CHLORIDE 0.9 % IV SOLN: 500.0000 mL | INTRAVENOUS | Status: DC

## 2012-05-10 NOTE — Progress Notes (Signed)
NO FOOD ALLERGIES, NO EGG OR SOY ALLERGY. EWM

## 2012-05-10 NOTE — Patient Instructions (Addendum)
YOU HAD AN ENDOSCOPIC PROCEDURE TODAY AT THE [facility]: Refer to the procedure report that was given to you for any specific questions about what was found during the examination.  If the procedure report does not answer your questions, please call your gastroenterologist to clarify.  If you requested that your care partner not be given the details of your procedure findings, then the procedure report has been included in a sealed envelope for you to review at your convenience later.  YOU SHOULD EXPECT: Some feelings of bloating in the abdomen. Passage of more gas than usual.  Walking can help get rid of the air that was put into your GI tract during the procedure and reduce the bloating. If you had a lower endoscopy (such as a colonoscopy or flexible sigmoidoscopy) you may notice spotting of blood in your stool or on the toilet paper. If you underwent a bowel prep for your procedure, then you may not have a normal bowel movement for a few days.  DIET: Your first meal following the procedure should be a light meal and then it is ok to progress to your normal diet.  A half-sandwich or bowl of soup is an example of a good first meal.  Heavy or fried foods are harder to digest and may make you feel nauseous or bloated.  Likewise meals heavy in dairy and vegetables can cause extra gas to form and this can also increase the bloating.  Drink plenty of fluids but you should avoid alcoholic beverages for 24 hours.  ACTIVITY: Your care partner should take you home directly after the procedure.  You should plan to take it easy, moving slowly for the rest of the day.  You can resume normal activity the day after the procedure however you should NOT DRIVE or use heavy machinery for 24 hours (because of the sedation medicines used during the test).    SYMPTOMS TO REPORT IMMEDIATELY: A gastroenterologist can be reached at any hour.  During normal business hours, 8:30 AM to 5:00 PM Monday through Friday,  call [phone].  After hours and on weekends, please call the GI answering service at [phone] who will take a message and have the physician on call contact you.   Following lower endoscopy (colonoscopy or flexible sigmoidoscopy):  Excessive amounts of blood in the stool  Significant tenderness or worsening of abdominal pains  Swelling of the abdomen that is new, acute  Fever of 100F or higher  Following upper endoscopy (EGD)  Vomiting of blood or coffee ground material  New chest pain or pain under the shoulder blades  Painful or persistently difficult swallowing  New shortness of breath  Fever of 100F or higher  Black, tarry-looking stools  FOLLOW UP: If any biopsies were taken you will be contacted by phone or by letter within the next 1-3 weeks.  Call your gastroenterologist if you have not heard about the biopsies in 3 weeks.  Our staff will call the home number listed on your records the next business day following your procedure to check on you and address any questions or concerns that you may have at that time regarding the information given to you following your procedure. This is a courtesy call and so if there is no answer at the home number and we have not heard from you through the emergency physician on call, we will assume that you have returned to your regular daily activities without incident.  SIGNATURES/CONFIDENTIALITY: You and/or your care   partner have signed paperwork which will be entered into your electronic medical record.  These signatures attest to the fact that that the information above on your After Visit Summary has been reviewed and is understood.  Full responsibility of the confidentiality of this discharge information lies with you and/or your care-partner.  

## 2012-05-10 NOTE — Progress Notes (Signed)
Patient did not experience any of the following events: a burn prior to discharge; a fall within the facility; wrong site/side/patient/procedure/implant event; or a hospital transfer or hospital admission upon discharge from the facility. (G8907) Patient did not have preoperative order for IV antibiotic SSI prophylaxis. (G8918)  

## 2012-05-10 NOTE — Op Note (Signed)
[facility] [address] [place], [address]   COLONOSCOPY PROCEDURE REPORT  PATIENT: Stephanie Cross, Stephanie Cross.  MR#: [id number] BIRTHDATE: 1950/05/27 , 61  yrs. old GENDER: Female ENDOSCOPIST: Hart Carwin, MD REFERRED BY:  Nani Gasser, M.D. PROCEDURE DATE:  05/10/2012 PROCEDURE:   Colonoscopy, screening ASA CLASS:   Class I INDICATIONS:Patient's immediate family history of colon cancer and brother died of colon cancer age 61. MEDICATIONS: MAC sedation, administered by CRNA and propofol (Diprivan) 200mg  IV  DESCRIPTION OF PROCEDURE:   After the risks and benefits and of the procedure were explained, informed consent was obtained.  A digital rectal exam revealed no abnormalities of the rectum.    The LB [id number]  endoscope was introduced through the anus and advanced to the cecum, which was identified by both the appendix and ileocecal valve .  The quality of the prep was excellent, using MoviPrep .  The instrument was then slowly withdrawn as the colon was fully examined.     COLON FINDINGS: A normal appearing cecum, ileocecal valve, and appendiceal orifice were identified.  The ascending, hepatic flexure, transverse, splenic flexure, descending, sigmoid colon and rectum appeared unremarkable.  No polyps or cancers were seen. Retroflexed views revealed no abnormalities.     The scope was then withdrawn from the patient and the procedure completed.  COMPLICATIONS: There were no complications. ENDOSCOPIC IMPRESSION: Normal colon  RECOMMENDATIONS: High fiber diet   REPEAT EXAM: In 5 year(s)  for Colonoscopy.  cc:  _______________________________ eSignedHart Carwin, MD 05/10/2012 8:54 AM

## 2012-05-14 ENCOUNTER — Telehealth: Payer: Self-pay | Admitting: *Deleted

## 2012-05-14 NOTE — Telephone Encounter (Signed)
Left message that we called for f/u 

## 2012-07-26 ENCOUNTER — Other Ambulatory Visit: Payer: Self-pay | Admitting: *Deleted

## 2012-07-26 ENCOUNTER — Other Ambulatory Visit: Payer: Self-pay | Admitting: Family Medicine

## 2012-07-26 MED ORDER — METOPROLOL SUCCINATE ER 100 MG PO TB24: 100.0000 mg | ORAL_TABLET | Freq: Every day | ORAL | Status: DC

## 2012-07-31 ENCOUNTER — Other Ambulatory Visit: Payer: Self-pay | Admitting: Family Medicine

## 2012-11-01 ENCOUNTER — Other Ambulatory Visit: Payer: Self-pay | Admitting: Family Medicine

## 2012-11-03 ENCOUNTER — Other Ambulatory Visit: Payer: Self-pay | Admitting: Family Medicine

## 2012-11-05 ENCOUNTER — Other Ambulatory Visit: Payer: Self-pay | Admitting: Family Medicine

## 2012-11-06 ENCOUNTER — Other Ambulatory Visit: Payer: Self-pay | Admitting: *Deleted

## 2012-11-06 ENCOUNTER — Other Ambulatory Visit: Payer: Self-pay | Admitting: Family Medicine

## 2012-11-06 MED ORDER — METOPROLOL SUCCINATE ER 100 MG PO TB24: 100.0000 mg | ORAL_TABLET | Freq: Every day | ORAL | Status: DC

## 2012-11-09 ENCOUNTER — Ambulatory Visit (INDEPENDENT_AMBULATORY_CARE_PROVIDER_SITE_OTHER): Payer: BC Managed Care – PPO | Admitting: Family Medicine

## 2012-11-09 ENCOUNTER — Encounter: Payer: Self-pay | Admitting: Family Medicine

## 2012-11-09 VITALS — BP 126/72 | HR 72 | Wt 178.0 lb

## 2012-11-09 DIAGNOSIS — I10 Essential (primary) hypertension: Secondary | ICD-10-CM

## 2012-11-09 DIAGNOSIS — J45909 Unspecified asthma, uncomplicated: Secondary | ICD-10-CM

## 2012-11-09 MED ORDER — METOPROLOL SUCCINATE ER 100 MG PO TB24: ORAL_TABLET | ORAL | Status: DC

## 2012-11-09 NOTE — Progress Notes (Signed)
  Subjective:    Patient ID: Stephanie Cross, female    DOB: [birth year], 62 y.o.   MRN: [id number]  HPI HTN-   Pt denies chest pain, SOB, dizziness, or heart palpitations.  Taking meds as directed w/o problems.  Denies medication side effects.    Asthma - doing well. She's only had to use her inhaler once in the past week. No recent exacerbations. She still on her previous inhaler. She is on any refills today on that.   Review of Systems     Objective:   Physical Exam  Constitutional: She is oriented to person, place, and time. She appears well-developed and well-nourished.  HENT:  Head: Normocephalic and atraumatic.  Cardiovascular: Normal rate, regular rhythm and normal heart sounds.   Pulmonary/Chest: Effort normal and breath sounds normal.  Neurological: She is alert and oriented to person, place, and time.  Skin: Skin is warm and dry.  Psychiatric: She has a normal mood and affect. Her behavior is normal.          Assessment & Plan:  HTN- well controlled. F/U in 6 months.   Asthma - well controlled.  F/U in 6 months.   Due for pap. Encouraged her to scheduler for CPE pap.  Reminded her she's also due for mammogram but declined today.

## 2013-01-31 ENCOUNTER — Other Ambulatory Visit: Payer: Self-pay | Admitting: Family Medicine

## 2013-02-07 ENCOUNTER — Other Ambulatory Visit: Payer: Self-pay | Admitting: Family Medicine

## 2013-05-04 ENCOUNTER — Other Ambulatory Visit: Payer: Self-pay | Admitting: Family Medicine

## 2013-05-11 ENCOUNTER — Other Ambulatory Visit: Payer: Self-pay | Admitting: Family Medicine

## 2013-05-14 ENCOUNTER — Other Ambulatory Visit: Payer: Self-pay | Admitting: Family Medicine

## 2013-05-14 LAB — BASIC METABOLIC PANEL: Glucose: 103 mg/dL

## 2013-05-14 LAB — LIPID PANEL
CHOLESTEROL: 200 mg/dL (ref 0–200)
HDL: 74 mg/dL — AB (ref 35–70)
LDL CALC: 95 mg/dL
Triglycerides: 158 mg/dL (ref 40–160)

## 2013-05-15 ENCOUNTER — Telehealth: Payer: Self-pay | Admitting: *Deleted

## 2013-05-15 NOTE — Telephone Encounter (Signed)
Good to separate med.  Also recommend she keep a daily log and schedule f/u appt. She is due for her 6 mo check up anyway.  When here 6 months ago pulse was in the 70s but something may have changed.

## 2013-05-15 NOTE — Telephone Encounter (Signed)
Pt called back and stated that her bp was 118/68 and her pulse 42-48 yesterday. this morning her pulse was 72 right arm@ 1036 and in Left it was 68. It was 809 am before meds BP 159/90 45 and was taken again it was BP 158/98 p 58 she took her meds this morning. She did not take a claritin today. She is concerned about whether or not her bp meds are as effective. No dizziness or tiredness. she stated that she has been taking both meds in the morning I suggested to her that she should be taking the hctz in the morning and the metoprolol at dinner. Please advise.Deno Etienneonya L Alizee Maple

## 2013-05-15 NOTE — Telephone Encounter (Signed)
Pt called and lvm stating that she had a health screening done and was told that her pulse was low and that she should f/u with her pcp.Stephanie Cross

## 2013-05-16 NOTE — Telephone Encounter (Signed)
Called and gave pt dr Shelah Lewandowskymetheney's recommendations. appt made.Stephanie Cross

## 2013-05-20 ENCOUNTER — Ambulatory Visit (INDEPENDENT_AMBULATORY_CARE_PROVIDER_SITE_OTHER): Payer: BC Managed Care – PPO | Admitting: Family Medicine

## 2013-05-20 ENCOUNTER — Encounter: Payer: Self-pay | Admitting: Family Medicine

## 2013-05-20 VITALS — BP 152/82 | HR 54 | Ht 68.0 in | Wt 179.0 lb

## 2013-05-20 DIAGNOSIS — R9431 Abnormal electrocardiogram [ECG] [EKG]: Secondary | ICD-10-CM

## 2013-05-20 DIAGNOSIS — I10 Essential (primary) hypertension: Secondary | ICD-10-CM

## 2013-05-20 DIAGNOSIS — R001 Bradycardia, unspecified: Secondary | ICD-10-CM

## 2013-05-20 DIAGNOSIS — I498 Other specified cardiac arrhythmias: Secondary | ICD-10-CM

## 2013-05-20 MED ORDER — METOPROLOL SUCCINATE ER 50 MG PO TB24: ORAL_TABLET | ORAL | Status: DC

## 2013-05-20 MED ORDER — LISINOPRIL 20 MG PO TABS: 20.0000 mg | ORAL_TABLET | Freq: Every day | ORAL | Status: DC

## 2013-05-20 NOTE — Patient Instructions (Signed)
DASH Diet  The DASH diet stands for "Dietary Approaches to Stop Hypertension." It is a healthy eating plan that has been shown to reduce high blood pressure (hypertension) in as little as 14 days, while also possibly providing other significant health benefits. These other health benefits include reducing the risk of breast cancer after menopause and reducing the risk of type 2 diabetes, heart disease, colon cancer, and stroke. Health benefits also include weight loss and slowing kidney failure in patients with chronic kidney disease.   DIET GUIDELINES  · Limit salt (sodium). Your diet should contain less than 1500 mg of sodium daily.  · Limit refined or processed carbohydrates. Your diet should include mostly whole grains. Desserts and added sugars should be used sparingly.  · Include small amounts of heart-healthy fats. These types of fats include nuts, oils, and tub margarine. Limit saturated and trans fats. These fats have been shown to be harmful in the body.  CHOOSING FOODS   The following food groups are based on a 2000 calorie diet. See your Registered Dietitian for individual calorie needs.  Grains and Grain Products (6 to 8 servings daily)  · Eat More Often: Whole-wheat bread, brown rice, whole-grain or wheat pasta, quinoa, popcorn without added fat or salt (air popped).  · Eat Less Often: White bread, white pasta, white rice, cornbread.  Vegetables (4 to 5 servings daily)  · Eat More Often: Fresh, frozen, and canned vegetables. Vegetables may be raw, steamed, roasted, or grilled with a minimal amount of fat.  · Eat Less Often/Avoid: Creamed or fried vegetables. Vegetables in a cheese sauce.  Fruit (4 to 5 servings daily)  · Eat More Often: All fresh, canned (in natural juice), or frozen fruits. Dried fruits without added sugar. One hundred percent fruit juice (½ cup [237 mL] daily).  · Eat Less Often: Dried fruits with added sugar. Canned fruit in light or heavy syrup.  Lean Meats, Fish, and Poultry (2  servings or less daily. One serving is 3 to 4 oz [85-114 g]).  · Eat More Often: Ninety percent or leaner ground beef, tenderloin, sirloin. Round cuts of beef, chicken breast, turkey breast. All fish. Grill, bake, or broil your meat. Nothing should be fried.  · Eat Less Often/Avoid: Fatty cuts of meat, turkey, or chicken leg, thigh, or wing. Fried cuts of meat or fish.  Dairy (2 to 3 servings)  · Eat More Often: Low-fat or fat-free milk, low-fat plain or light yogurt, reduced-fat or part-skim cheese.  · Eat Less Often/Avoid: Milk (whole, 2%). Whole milk yogurt. Full-fat cheeses.  Nuts, Seeds, and Legumes (4 to 5 servings per week)  · Eat More Often: All without added salt.  · Eat Less Often/Avoid: Salted nuts and seeds, canned beans with added salt.  Fats and Sweets (limited)  · Eat More Often: Vegetable oils, tub margarines without trans fats, sugar-free gelatin. Mayonnaise and salad dressings.  · Eat Less Often/Avoid: Coconut oils, palm oils, butter, stick margarine, cream, half and half, cookies, candy, pie.  FOR MORE INFORMATION  The Dash Diet Eating Plan: www.dashdiet.org  Document Released: 12/30/2010 Document Revised: 04/04/2011 Document Reviewed: 12/30/2010  [employer]® Patient Information ©2014 [employer].

## 2013-05-20 NOTE — Progress Notes (Signed)
Subjective:    Patient ID: Stephanie Cross, female    DOB: 09/03/1950, 63 y.o.   MRN: [id number]  HPI Followup  6 mo  hypertension-she brought in her log of blood pressure readings. If ranged from 137 to 178 systolic. Pulse is primarily remained in the 60s but there are a few as low as 39. She's currently on metoprolol and nitroglycerin chlorothiazide.  Also concerned her pulse has ben low as well.  She denies any lightheadedness, dizziness, shortness of breath or palpitations. She has been asymptomatic even when her pulse is been low.  Brought in copy of lipids screening done through biometrics screening at her husband's work. While being evaluated by a nurse at the biometric screen she was told that her pulse was in the 40s. She was completely asymptomatic that day. She has been on metoprolol for several years.  Review of Systems  BP 152/82  Pulse 54  Ht 5\' 8"  (1.727 m)  Wt 179 lb (81.194 kg)  BMI 27.22 kg/m2    No Known Allergies  Past Medical History  Diagnosis Date  . Menopause syndrome   . Hypertension   . Allergy   . Fungal toenail infection     being treated for 2 weeks ago/03/2012  . Anxiety     Past Surgical History  Procedure Laterality Date  . Bone spur    . Dilation and curettage of uterus    . Exploratory laparotomy    . Miscarriages       2 times  . Wisdom tooth extraction      History   Social History  . Marital Status: Single    Spouse Name: N/A    Number of Children: N/A  . Years of Education: N/A   Occupational History  . Not on file.   Social History Main Topics  . Smoking status: Never Smoker   . Smokeless tobacco: Not on file  . Alcohol Use: 2.4 - 3.6 oz/week    4-6 Glasses of wine per week     Comment: occasiona;  Marland Kitchen. Drug Use: No  . Sexual Activity: Not on file     Comment: admin asst, walks on treadmill 4 X week, married.   Other Topics Concern  . Not on file   Social History Narrative  . No narrative on file    Family History   Problem Relation Age of Onset  . COPD Mother     smoker  . Lung cancer Father     smoker   . Colon cancer Brother     Outpatient Encounter Prescriptions as of 05/20/2013  Medication Sig  . albuterol (PROAIR HFA) 108 (90 BASE) MCG/ACT inhaler Inhale 2 puffs into the lungs every 6 (six) hours as needed for wheezing or shortness of breath.  . hydrochlorothiazide (HYDRODIURIL) 25 MG tablet TAKE 1 TABLET BY MOUTH EVERY DAY  . Loratadine (CLARITIN PO) Take by mouth daily.  . metoprolol succinate (TOPROL-XL) 50 MG 24 hr tablet TAKE 1 TABLET (100 MG TOTAL) BY MOUTH DAILY. TAKE WITH OR IMMEDIATELY FOLLOWING A MEAL.  . Multiple Vitamin (MULTIVITAMIN) tablet Take by mouth. MultiVites-Take 2 daily  . [DISCONTINUED] metoprolol succinate (TOPROL-XL) 100 MG 24 hr tablet TAKE 1 TABLET (100 MG TOTAL) BY MOUTH DAILY. TAKE WITH OR IMMEDIATELY FOLLOWING A MEAL.  . [DISCONTINUED] metoprolol succinate (TOPROL-XL) 100 MG 24 hr tablet TAKE 1/2 TABLET (100 MG TOTAL) BY MOUTH DAILY. TAKE WITH OR IMMEDIATELY FOLLOWING A MEAL.  Marland Kitchen. lisinopril (PRINIVIL,ZESTRIL) 20 MG tablet  Take 1 tablet (20 mg total) by mouth daily.          Objective:   Physical Exam  Constitutional: She is oriented to person, place, and time. She appears well-developed and well-nourished.  HENT:  Head: Normocephalic and atraumatic.  Eyes: Pupils are equal, round, and reactive to light.  Neck: Neck supple. No thyromegaly present.  Cardiovascular: Normal rate, regular rhythm and normal heart sounds.   No carotid bruits  Pulmonary/Chest: Effort normal and breath sounds normal.  Neurological: She is alert and oriented to person, place, and time.  Skin: Skin is warm and dry.  Psychiatric: She has a normal mood and affect. Her behavior is normal.          Assessment & Plan:  HTN- Uncontrolled.  Will decrease metoprolol to 50 mg and she is getting some bradycardia. We'll add lisinopril 20 mg to maintain blood pressure control. Continue  hydrochlorothiazide. We did check a BMP at followup in one month.  Bradycardia - will do an EKG today. Also consider BP machine may be cutting pulse in half. May be secondary to BBlocker medication. He EKG today shows rate of 62 beats per minute, normal sinus rhythm with a PVC. She does have inverted T waves in the lateral leads.  Will schedule for stress test on the treadmill since she does have a slightly abnormal EKG with inverted T waves in the lateral leads.  Reviewed copy of lipids.  TG were up a little.  Pulse in the 40s.

## 2013-05-21 ENCOUNTER — Encounter: Payer: Self-pay | Admitting: *Deleted

## 2013-05-21 LAB — CBC
HEMATOCRIT: 41.6 % (ref 36.0–46.0)
Hemoglobin: 14.5 g/dL (ref 12.0–15.0)
MCH: 29.1 pg (ref 26.0–34.0)
MCHC: 34.9 g/dL (ref 30.0–36.0)
MCV: 83.4 fL (ref 78.0–100.0)
Platelets: 197 10*3/uL (ref 150–400)
RBC: 4.99 MIL/uL (ref 3.87–5.11)
RDW: 14.3 % (ref 11.5–15.5)
WBC: 5.5 10*3/uL (ref 4.0–10.5)

## 2013-05-21 LAB — TSH: TSH: 1.915 u[IU]/mL (ref 0.350–4.500)

## 2013-05-22 LAB — LIPID PANEL
CHOL/HDL RATIO: 3.2 ratio
Cholesterol: 202 mg/dL — ABNORMAL HIGH (ref 0–200)
HDL: 63 mg/dL (ref >39)
LDL Cholesterol: 120 mg/dL — ABNORMAL HIGH (ref 0–99)
Triglycerides: 97 mg/dL (ref <150)
VLDL: 19 mg/dL (ref 0–40)

## 2013-05-22 LAB — COMPLETE METABOLIC PANEL WITH GFR
ALK PHOS: 44 U/L (ref 39–117)
ALT: 14 U/L (ref 0–35)
AST: 16 U/L (ref 0–37)
Albumin: 4.3 g/dL (ref 3.5–5.2)
BUN: 21 mg/dL (ref 6–23)
CO2: 28 mEq/L (ref 19–32)
Calcium: 9.2 mg/dL (ref 8.4–10.5)
Chloride: 96 mEq/L (ref 96–112)
Creat: 1.06 mg/dL (ref 0.50–1.10)
GFR, Est African American: 65 mL/min
GFR, Est Non African American: 57 mL/min — ABNORMAL LOW
Glucose, Bld: 95 mg/dL (ref 70–99)
POTASSIUM: 3.2 meq/L — AB (ref 3.5–5.3)
SODIUM: 135 meq/L (ref 135–145)
TOTAL PROTEIN: 6.5 g/dL (ref 6.0–8.3)
Total Bilirubin: 1 mg/dL (ref 0.2–1.2)

## 2013-07-04 ENCOUNTER — Encounter: Payer: BC Managed Care – PPO | Admitting: Physician Assistant

## 2013-08-06 ENCOUNTER — Other Ambulatory Visit: Payer: Self-pay | Admitting: *Deleted

## 2013-08-06 MED ORDER — LISINOPRIL 20 MG PO TABS: 20.0000 mg | ORAL_TABLET | Freq: Every day | ORAL | Status: DC

## 2013-08-12 ENCOUNTER — Other Ambulatory Visit: Payer: Self-pay | Admitting: Family Medicine

## 2013-08-15 ENCOUNTER — Other Ambulatory Visit: Payer: Self-pay | Admitting: Family Medicine

## 2013-08-18 ENCOUNTER — Other Ambulatory Visit: Payer: Self-pay | Admitting: Family Medicine

## 2013-08-21 ENCOUNTER — Ambulatory Visit (INDEPENDENT_AMBULATORY_CARE_PROVIDER_SITE_OTHER): Payer: BC Managed Care – PPO | Admitting: Physician Assistant

## 2013-08-21 ENCOUNTER — Encounter: Payer: Self-pay | Admitting: Physician Assistant

## 2013-08-21 VITALS — Ht 68.0 in

## 2013-08-21 DIAGNOSIS — I472 Ventricular tachycardia: Secondary | ICD-10-CM

## 2013-08-21 DIAGNOSIS — I498 Other specified cardiac arrhythmias: Secondary | ICD-10-CM

## 2013-08-21 DIAGNOSIS — R9431 Abnormal electrocardiogram [ECG] [EKG]: Secondary | ICD-10-CM

## 2013-08-21 DIAGNOSIS — I4729 Other ventricular tachycardia: Secondary | ICD-10-CM

## 2013-08-21 DIAGNOSIS — R001 Bradycardia, unspecified: Secondary | ICD-10-CM

## 2013-08-21 DIAGNOSIS — I10 Essential (primary) hypertension: Secondary | ICD-10-CM

## 2013-08-21 NOTE — Progress Notes (Signed)
Exercise Treadmill Test  Stephanie Cross is a 63 y.o. female with a hx of HTN referred by PCP for ETT 2/2 abnormal ECG.  Never a smoker.  No hx of diabetes.  No significant FHx of CAD.  No hx of CP, SOB, syncope.    Pre-Exercise Testing Evaluation Rhythm: normal sinus  Rate: 61 bpm     Test  Exercise Tolerance Test Ordering MD: Kristeen MissPhilip Nahser, MD  Interpreting MD: Tereso NewcomerScott Kal Chait, PA-C  Unique Test No: 1  Treadmill:  1  Indication for ETT: Abn EKG  Contraindication to ETT: No   Stress Modality: exercise - treadmill  Cardiac Imaging Performed: non   Protocol: standard Bruce - maximal  Max BP:  202/74  Max MPHR (bpm):  159 85% MPR (bpm):  135  MPHR obtained (bpm):  137 % MPHR obtained:  86  Reached 85% MPHR (min:sec):  7:38 Total Exercise Time (min-sec):  8;00  Workload in METS:  10.1 Borg Scale: 14  Reason ETT Terminated:  patient's desire to stop    ST Segment Analysis At Rest: normal ST segments - no evidence of significant ST depression With Exercise: non-specific ST changes  Other Information Arrhythmia:  Yes Angina during ETT:  absent (0) Quality of ETT:  diagnostic  ETT Interpretation:  normal - no evidence of ischemia by ST analysis  Comments: Good exercise capacity. No chest pain. Normal BP response to exercise. No ST changes to suggest ischemia.  Frequent PVCs during exercise.  One run of NSVT (4 beats).  Recommendations: Suggest echocardiogram given ectopy and NSVT. I will schedule this. Follow up with PCP as directed. If Echo normal, would continue beta blocker. If echo abnormal, she will need follow up with cardiology. Discussed with patient. Signed,  Tereso NewcomerScott Kathan Kirker, PA-C   08/21/2013 9:36 AM

## 2013-08-22 ENCOUNTER — Telehealth: Payer: Self-pay

## 2013-08-22 NOTE — Telephone Encounter (Signed)
Patient called to find out what is the dose for the Metoprolol. I advised 50 mg once daily.

## 2013-09-02 ENCOUNTER — Other Ambulatory Visit: Payer: Self-pay

## 2013-09-02 ENCOUNTER — Ambulatory Visit (HOSPITAL_COMMUNITY): Payer: BC Managed Care – PPO | Attending: Cardiology | Admitting: Radiology

## 2013-09-02 ENCOUNTER — Other Ambulatory Visit: Payer: Self-pay | Admitting: Family Medicine

## 2013-09-02 DIAGNOSIS — R9431 Abnormal electrocardiogram [ECG] [EKG]: Secondary | ICD-10-CM | POA: Insufficient documentation

## 2013-09-02 DIAGNOSIS — I472 Ventricular tachycardia: Secondary | ICD-10-CM

## 2013-09-02 DIAGNOSIS — I4729 Other ventricular tachycardia: Secondary | ICD-10-CM

## 2013-09-02 DIAGNOSIS — I10 Essential (primary) hypertension: Secondary | ICD-10-CM

## 2013-09-02 NOTE — Progress Notes (Signed)
Echocardiogram performed.  

## 2013-11-08 ENCOUNTER — Other Ambulatory Visit: Payer: Self-pay | Admitting: Family Medicine

## 2013-11-08 ENCOUNTER — Ambulatory Visit (INDEPENDENT_AMBULATORY_CARE_PROVIDER_SITE_OTHER): Payer: BC Managed Care – PPO | Admitting: Family Medicine

## 2013-11-08 ENCOUNTER — Encounter: Payer: Self-pay | Admitting: Family Medicine

## 2013-11-08 VITALS — BP 125/68 | HR 68 | Temp 98.1°F | Wt 174.0 lb

## 2013-11-08 DIAGNOSIS — J4520 Mild intermittent asthma, uncomplicated: Secondary | ICD-10-CM

## 2013-11-08 MED ORDER — DOXYCYCLINE HYCLATE 100 MG PO TABS: ORAL_TABLET | ORAL | Status: AC

## 2013-11-08 MED ORDER — PREDNISONE 50 MG PO TABS: ORAL_TABLET | ORAL | Status: DC

## 2013-11-08 NOTE — Progress Notes (Signed)
CC: Stephanie Cross is a 63 y.o. female is here for Cough   Subjective: HPI:  Complains of a nonproductive cough that has been present on a daily basis for the past week. It began with cold symptoms she described as stuffy nose, sore throat, productive cough however these all resolved days ago. Symptoms are persistent throughout the day nothing particular seems to make them worse they're slightly improved with throat lozenges. No other interventions as of yet. Denies shortness of breath, wheezing, chest pain, sore throat, rashes, fevers, chills, facial pressure.   Review Of Systems Outlined In HPI  Past Medical History  Diagnosis Date  . Menopause syndrome   . Hypertension   . Allergy   . Fungal toenail infection     being treated for 2 weeks ago/03/2012  . Anxiety     Past Surgical History  Procedure Laterality Date  . Bone spur    . Dilation and curettage of uterus    . Exploratory laparotomy    . Miscarriages       2 times  . Wisdom tooth extraction     Family History  Problem Relation Age of Onset  . COPD Mother     smoker  . Lung cancer Father     smoker   . Colon cancer Brother     History   Social History  . Marital Status: Single    Spouse Name: N/A    Number of Children: N/A  . Years of Education: N/A   Occupational History  . Not on file.   Social History Main Topics  . Smoking status: Never Smoker   . Smokeless tobacco: Not on file  . Alcohol Use: 2.4 - 3.6 oz/week    4-6 Glasses of wine per week     Comment: occasiona;  Marland Kitchen. Drug Use: No  . Sexual Activity: Not on file     Comment: admin asst, walks on treadmill 4 X week, married.   Other Topics Concern  . Not on file   Social History Narrative  . No narrative on file     Objective: BP 125/68  Pulse 68  Temp(Src) 98.1 F (36.7 C) (Oral)  Wt 174 lb (78.926 kg)  SpO2 97%  General: Alert and Oriented, No Acute Distress HEENT: Pupils equal, round, reactive to light. Conjunctivae clear.   External ears unremarkable, canals clear with intact TMs with appropriate landmarks.  Middle ear appears open without effusion. Pink inferior turbinates.  Moist mucous membranes, pharynx without inflammation nor lesions.  Neck supple without palpable lymphadenopathy nor abnormal masses. Lungs: Comfortable work of breathing with trace end expiratory wheezing in all lung fields without rhonchi rales or signs of consolidation Cardiac: Regular rate and rhythm. Normal S1/S2.  No murmurs, rubs, nor gallops.   Extremities: No peripheral edema.  Strong peripheral pulses.  Mental Status: No depression, anxiety, nor agitation. Skin: Warm and dry.  Assessment & Plan: Stephanie Cross was seen today for cough.  Diagnoses and associated orders for this visit:  Asthma, mild intermittent, uncomplicated - predniSONE (DELTASONE) 50 MG tablet; One by mouth daily for five days. - doxycycline (VIBRA-TABS) 100 MG tablet; One by mouth twice a day for ten days.    Asthma exacerbation due to recent viral respiratory illness, start prednisone and doxycycline, use albuterol on an as-needed basis that Stephanie Cross has at home   Return if symptoms worsen or fail to improve.

## 2013-11-14 ENCOUNTER — Other Ambulatory Visit: Payer: Self-pay | Admitting: Family Medicine

## 2013-11-15 ENCOUNTER — Other Ambulatory Visit: Payer: Self-pay | Admitting: Family Medicine

## 2013-11-16 ENCOUNTER — Other Ambulatory Visit: Payer: Self-pay | Admitting: Family Medicine

## 2014-01-08 ENCOUNTER — Other Ambulatory Visit: Payer: Self-pay | Admitting: Family Medicine

## 2014-01-08 IMAGING — CR DG CHEST 2V
2 series · 2 of 2 positions shown · non-contrast
Comparison: None.

CLINICAL DATA: Cough for 4 weeks

CHEST - 2 VIEW

[view not recorded (1 of 2)]
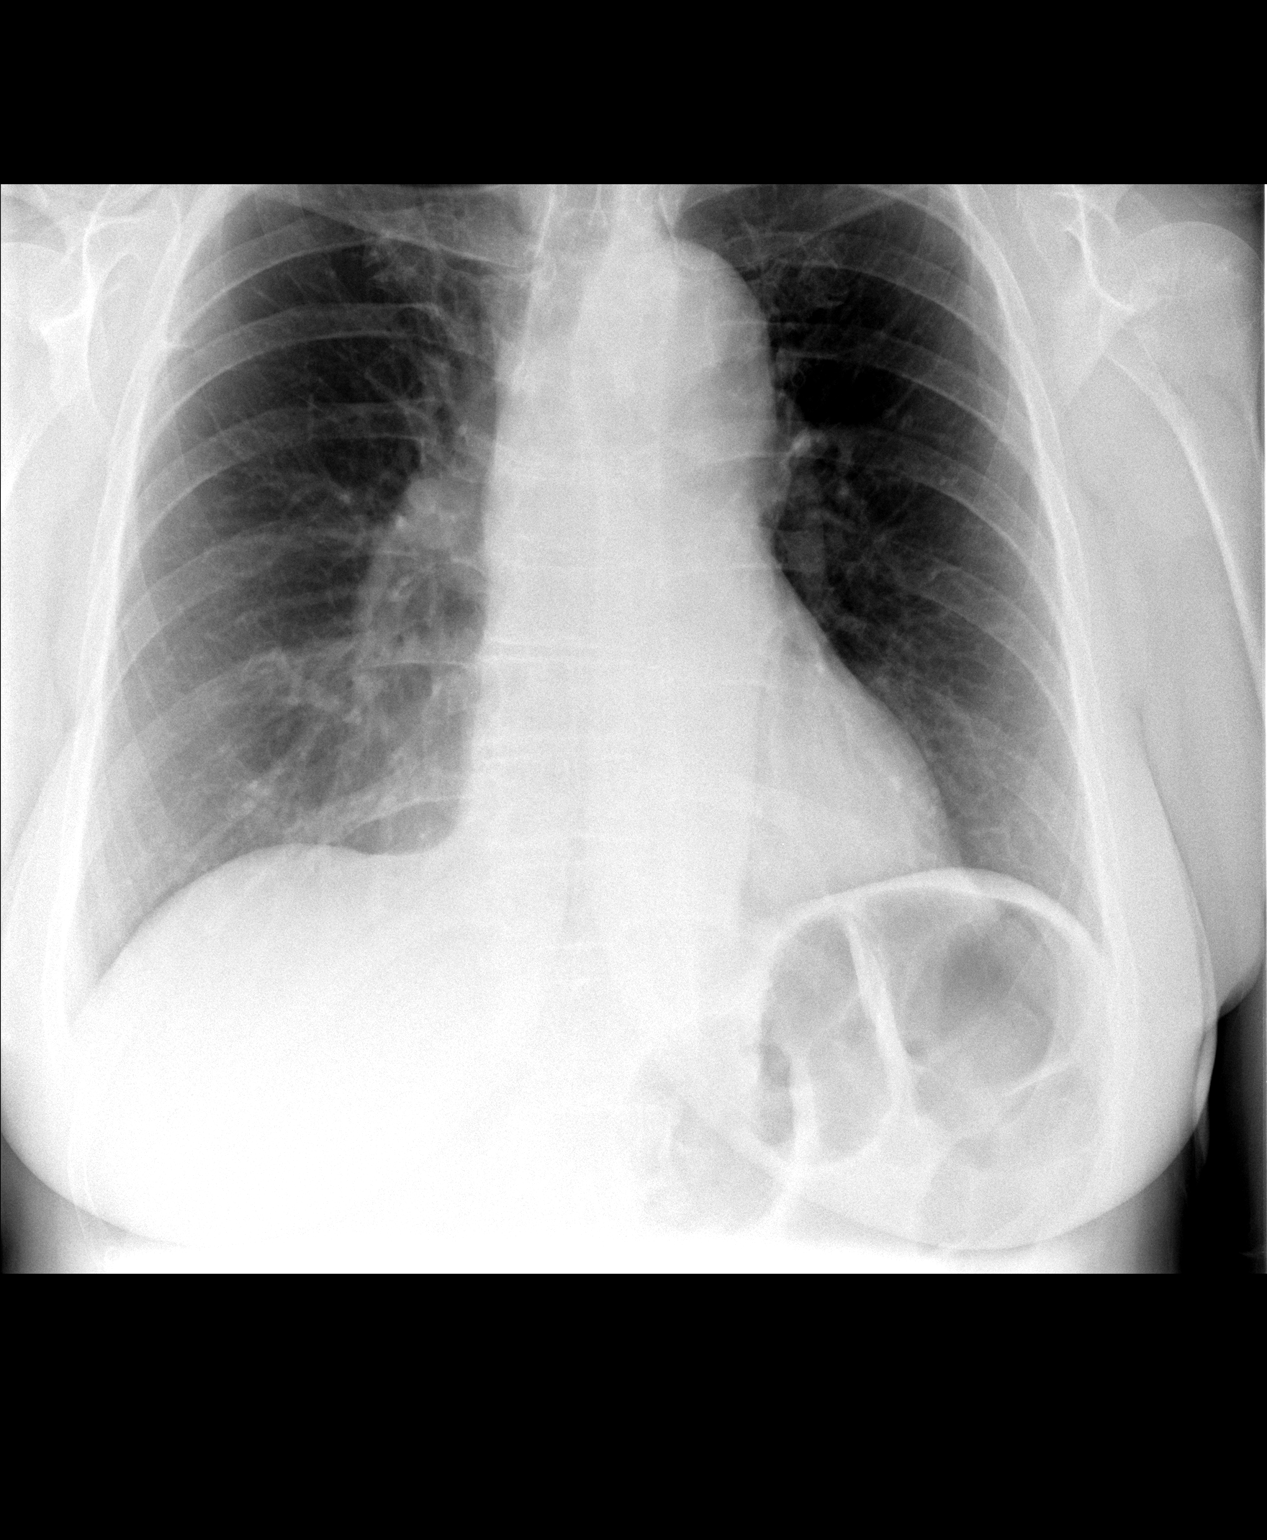

[view not recorded (2 of 2)]
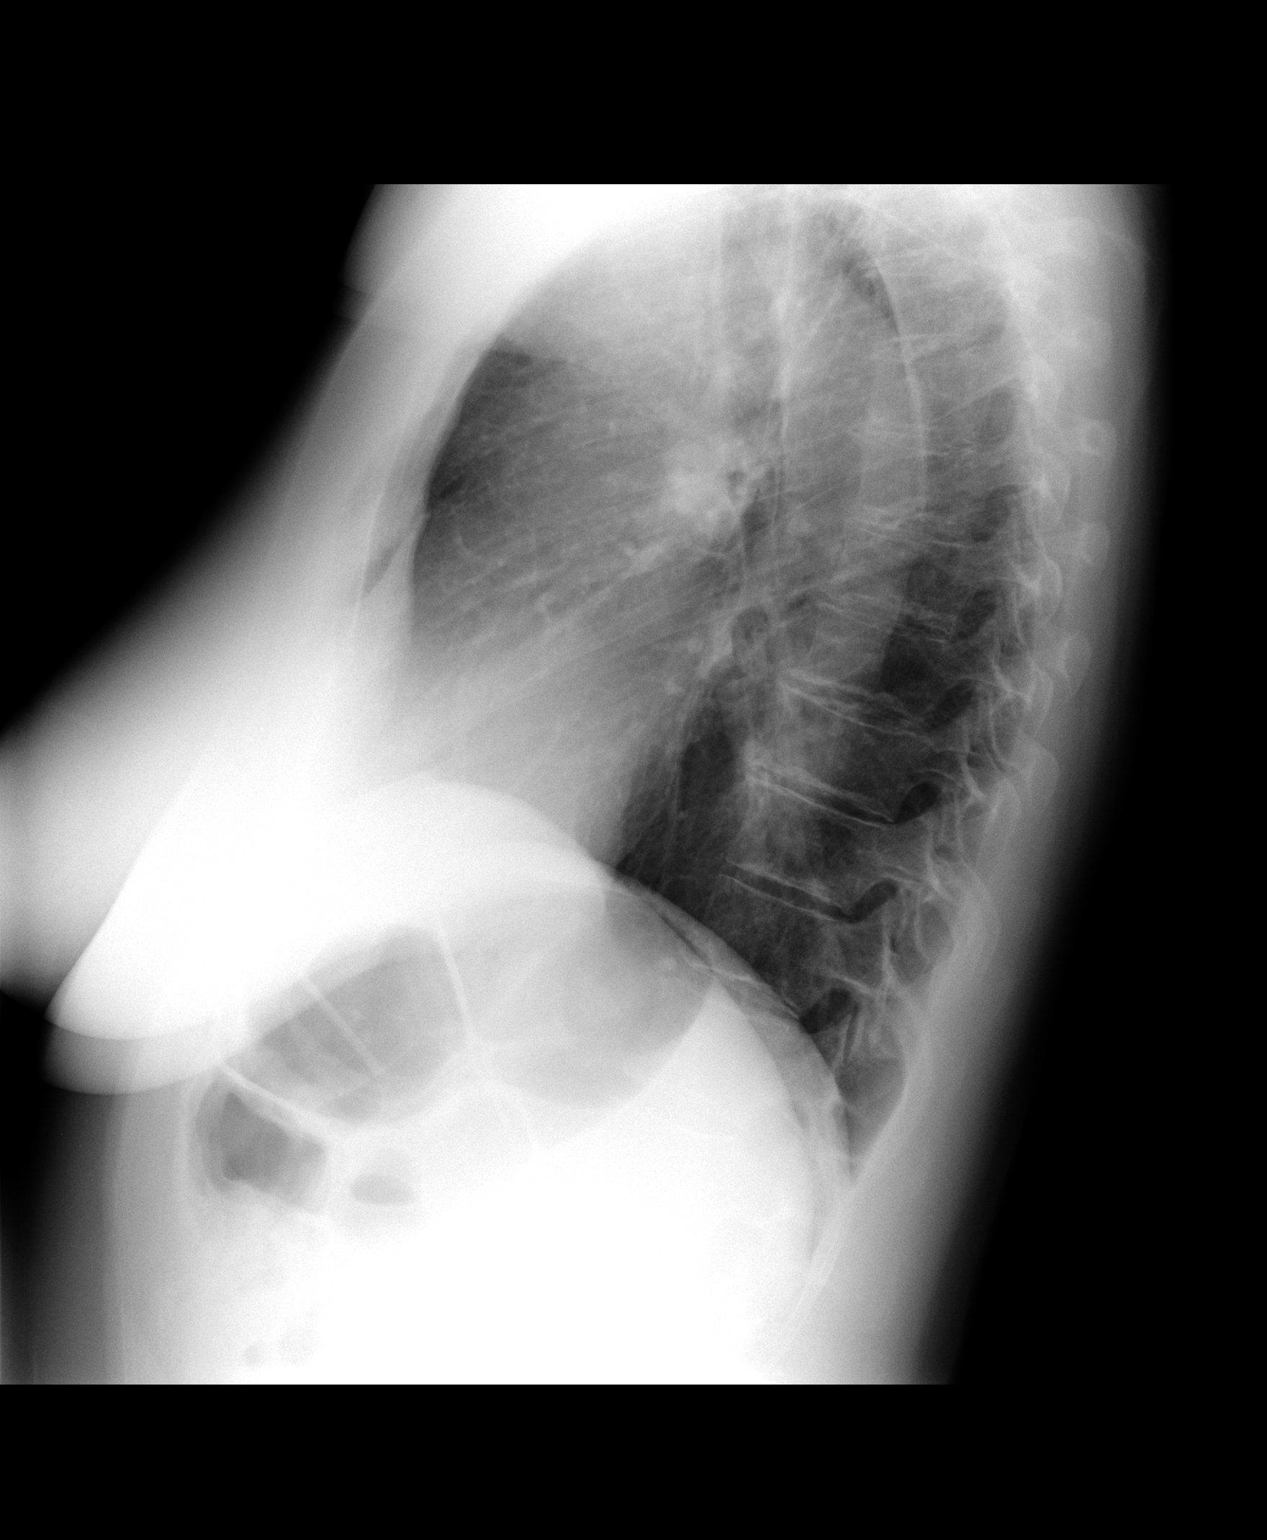

[2 of 2 positions shown; findings below may reference images not displayed]

FINDINGS: No active infiltrate or effusion is seen.  Mediastinal
contours appear normal.  The heart is within upper limits of
normal.  No acute bony abnormality is seen.
IMPRESSION: No active lung disease.

## 2014-02-03 ENCOUNTER — Ambulatory Visit (INDEPENDENT_AMBULATORY_CARE_PROVIDER_SITE_OTHER): Payer: BLUE CROSS/BLUE SHIELD | Admitting: Physician Assistant

## 2014-02-03 ENCOUNTER — Ambulatory Visit (INDEPENDENT_AMBULATORY_CARE_PROVIDER_SITE_OTHER): Payer: BLUE CROSS/BLUE SHIELD

## 2014-02-03 ENCOUNTER — Encounter: Payer: Self-pay | Admitting: Physician Assistant

## 2014-02-03 VITALS — BP 137/79 | HR 75 | Temp 97.8°F | Ht 68.0 in | Wt 170.0 lb

## 2014-02-03 DIAGNOSIS — J209 Acute bronchitis, unspecified: Secondary | ICD-10-CM

## 2014-02-03 DIAGNOSIS — R05 Cough: Secondary | ICD-10-CM

## 2014-02-03 DIAGNOSIS — J4520 Mild intermittent asthma, uncomplicated: Secondary | ICD-10-CM

## 2014-02-03 DIAGNOSIS — R059 Cough, unspecified: Secondary | ICD-10-CM

## 2014-02-03 DIAGNOSIS — R0989 Other specified symptoms and signs involving the circulatory and respiratory systems: Secondary | ICD-10-CM

## 2014-02-03 MED ORDER — AZITHROMYCIN 250 MG PO TABS: ORAL_TABLET | ORAL | Status: DC

## 2014-02-03 NOTE — Patient Instructions (Addendum)
zpak for 5 days.  Inhaler sample for next month. 2 puffs BID.

## 2014-02-03 NOTE — Progress Notes (Signed)
   Subjective:    Patient ID: Stephanie Cross, female    DOB: 11/12/1950, 64 y.o.   MRN: [id number]  HPI Patient presents to the clinic for follow-up after urgent care visit on 01/23/14. She was diagnosed with bronchitis. She was given Tessalon Perles and Cipro. She has 1 dose left of each. She does feel somewhat better but there remains a wet cough. She has a history of asthma. She has had some mild wheezing mostly at night. She denies any fever or chills. She still. Very run down and short of breath with any exertion.   Review of Systems  All other systems reviewed and are negative.      Objective:   Physical Exam  Constitutional: She appears well-developed and well-nourished.  HENT:  Head: Normocephalic and atraumatic.  Right Ear: External ear normal.  Left Ear: External ear normal.  Nose: Nose normal.  Mouth/Throat: Oropharynx is clear and moist. No oropharyngeal exudate.  Eyes: Conjunctivae are normal. Right eye exhibits no discharge. Left eye exhibits no discharge.  Neck: Normal range of motion. Neck supple.  Cardiovascular: Normal rate, regular rhythm and normal heart sounds.   Pulmonary/Chest: Effort normal and breath sounds normal. She has no wheezes.  Productive cough on exam today.  Lymphadenopathy:    She has no cervical adenopathy.  Skin: Skin is dry.  Psychiatric: She has a normal mood and affect. Her behavior is normal.          Assessment & Plan:  Acute bronchitis/cough-since cough is gone on for 3 weeks and still productive will get chest x-ray today. Patient was put on Cipro for bronchitis which is an unusual treatment. I did go ahead and give patient another round of azithromycin for 5 days. Follow-up as needed or if symptoms worsening.  Asthma, mild intermittent- since patient does have a history of asthma. Continue to use rescue inhaler as needed. I also did add a sample of Symbicort 2 puffs twice a day for the remainder of this month. Follow-up once off  Symbicort if noticing more shortness of breath and inflammation. We may need to continue this dose. I did not see the need at this time to continue with oral prednisone.

## 2014-02-10 ENCOUNTER — Other Ambulatory Visit: Payer: Self-pay | Admitting: Family Medicine

## 2014-03-26 ENCOUNTER — Other Ambulatory Visit: Payer: Self-pay | Admitting: *Deleted

## 2014-03-26 DIAGNOSIS — J45909 Unspecified asthma, uncomplicated: Secondary | ICD-10-CM

## 2014-03-26 MED ORDER — BUDESONIDE-FORMOTEROL FUMARATE 80-4.5 MCG/ACT IN AERO: 2.0000 | INHALATION_SPRAY | Freq: Two times a day (BID) | RESPIRATORY_TRACT | Status: DC

## 2014-03-27 ENCOUNTER — Telehealth: Payer: Self-pay

## 2014-03-27 NOTE — Telephone Encounter (Signed)
Received fax for [place] on symbicort. Sent through cover my meds waiting on auth. - CF

## 2014-04-01 ENCOUNTER — Other Ambulatory Visit: Payer: Self-pay | Admitting: *Deleted

## 2014-04-01 MED ORDER — MOMETASONE FURO-FORMOTEROL FUM 100-5 MCG/ACT IN AERO: 2.0000 | INHALATION_SPRAY | Freq: Two times a day (BID) | RESPIRATORY_TRACT | Status: DC

## 2014-04-01 NOTE — Telephone Encounter (Signed)
Spoke with [facility] on [address] for symbicort they had more questions so I answered them and now waiting on auth.-  The Pa reference # is [id number] - Cf

## 2014-04-04 NOTE — Telephone Encounter (Signed)
Medication was denied plan will pay  For advair or dulera - Cf

## 2014-04-07 NOTE — Telephone Encounter (Signed)
Received letter from [facility] they denied the symbicort. - CF

## 2014-04-10 ENCOUNTER — Other Ambulatory Visit: Payer: Self-pay | Admitting: Family Medicine

## 2014-05-11 ENCOUNTER — Other Ambulatory Visit: Payer: Self-pay | Admitting: Family Medicine

## 2014-05-12 NOTE — Telephone Encounter (Signed)
Needs appt before future refills

## 2014-05-15 ENCOUNTER — Other Ambulatory Visit: Payer: Self-pay | Admitting: Family Medicine

## 2014-05-16 NOTE — Telephone Encounter (Signed)
Needs appt before future refills

## 2014-05-21 ENCOUNTER — Other Ambulatory Visit: Payer: Self-pay | Admitting: Family Medicine

## 2014-05-24 ENCOUNTER — Other Ambulatory Visit: Payer: Self-pay | Admitting: Family Medicine

## 2014-05-25 ENCOUNTER — Other Ambulatory Visit: Payer: Self-pay | Admitting: Family Medicine

## 2014-06-11 ENCOUNTER — Other Ambulatory Visit: Payer: Self-pay | Admitting: Family Medicine

## 2014-06-12 ENCOUNTER — Telehealth: Payer: Self-pay | Admitting: *Deleted

## 2014-06-12 DIAGNOSIS — Z13220 Encounter for screening for lipoid disorders: Secondary | ICD-10-CM

## 2014-06-12 DIAGNOSIS — I10 Essential (primary) hypertension: Secondary | ICD-10-CM

## 2014-06-12 NOTE — Telephone Encounter (Signed)
Pt called and lvm requesting a 90 day refill for lisinopril.  I called her back and informed her that she would need to schedule a f/u appt to address her HTN and that she would also need to have labs done to check her kidney function. I advised pt that I would send a lab slip down and that she could go have them done at her convenience M-F 8-5 before she came in for her f/u appt. Pt told to rtn call with any questions.Loralee PacasBarkley, Toshiye Kever HolidayLynetta

## 2014-06-16 NOTE — Addendum Note (Signed)
Addended by: Deno EtienneBARKLEY, Dare Spillman L on: 06/16/2014 05:44 PM   Modules accepted: Orders

## 2014-06-17 ENCOUNTER — Other Ambulatory Visit: Payer: Self-pay | Admitting: Family Medicine

## 2014-06-21 ENCOUNTER — Other Ambulatory Visit: Payer: Self-pay | Admitting: Family Medicine

## 2014-06-24 ENCOUNTER — Ambulatory Visit (INDEPENDENT_AMBULATORY_CARE_PROVIDER_SITE_OTHER): Payer: BLUE CROSS/BLUE SHIELD | Admitting: Family Medicine

## 2014-06-24 ENCOUNTER — Encounter: Payer: Self-pay | Admitting: Family Medicine

## 2014-06-24 ENCOUNTER — Other Ambulatory Visit: Payer: Self-pay | Admitting: Family Medicine

## 2014-06-24 VITALS — BP 130/75 | HR 60 | Wt 165.0 lb

## 2014-06-24 DIAGNOSIS — T464X5A Adverse effect of angiotensin-converting-enzyme inhibitors, initial encounter: Secondary | ICD-10-CM

## 2014-06-24 DIAGNOSIS — J4520 Mild intermittent asthma, uncomplicated: Secondary | ICD-10-CM

## 2014-06-24 DIAGNOSIS — R05 Cough: Secondary | ICD-10-CM

## 2014-06-24 DIAGNOSIS — R058 Other specified cough: Secondary | ICD-10-CM

## 2014-06-24 DIAGNOSIS — J309 Allergic rhinitis, unspecified: Secondary | ICD-10-CM

## 2014-06-24 DIAGNOSIS — I10 Essential (primary) hypertension: Secondary | ICD-10-CM

## 2014-06-24 MED ORDER — LOSARTAN POTASSIUM 50 MG PO TABS: 50.0000 mg | ORAL_TABLET | Freq: Every day | ORAL | Status: DC

## 2014-06-24 MED ORDER — HYDROCHLOROTHIAZIDE 25 MG PO TABS: 25.0000 mg | ORAL_TABLET | Freq: Every day | ORAL | Status: DC

## 2014-06-24 MED ORDER — METOPROLOL SUCCINATE ER 50 MG PO TB24: 50.0000 mg | ORAL_TABLET | Freq: Every day | ORAL | Status: DC

## 2014-06-24 MED ORDER — LISINOPRIL 20 MG PO TABS: 20.0000 mg | ORAL_TABLET | Freq: Every day | ORAL | Status: DC

## 2014-06-24 NOTE — Progress Notes (Signed)
   Subjective:    Patient ID: Stephanie Cross, female    DOB: 02/21/1950, 64 y.o.   MRN: [id number]  HPI Hypertension- Pt denies chest pain, SOB, dizziness, or heart palpitations.  Taking meds as directed w/o problems.  Denies medication side effects.    AR-  - Does have spring allergies. Says feels like th claritin is not working well.    Asthma - She has noticed a spontaneous cough. It random. She says it's more of a dry cough in her throat. Not productive. She wonders if it could be from her medication. Last used her albuterol a few days ago.   She is using her albuterol maybe once or twice a week recently.   Review of Systems     Objective:   Physical Exam  Constitutional: She is oriented to person, place, and time. She appears well-developed and well-nourished.  HENT:  Head: Normocephalic and atraumatic.  Cardiovascular: Normal rate, regular rhythm and normal heart sounds.   Pulmonary/Chest: Effort normal and breath sounds normal.  Neurological: She is alert and oriented to person, place, and time.  Skin: Skin is warm and dry.  Psychiatric: She has a normal mood and affect. Her behavior is normal.          Assessment & Plan:  HTN -  Well-controlled. Continue current regimen. Follow-up in 3 months since I am switching her to losartan.  Cough, caused by ACE mostly likely.   Discontinue lisinopril and switch to losartan.  Given a month to see if the cough resolves. Follow-up in 3 months.  Asthma -  Well-controlled on current regimen. Discussed with her that if she starts using albuterol more than twice per week then please let us know and we can put her on inhaled corpus sterile for prophylaxis.   allergic rhinitis-recommend switching to either Zyrtec or Allegra. She and she feels like the Claritin is not nearly as effective.

## 2014-06-25 ENCOUNTER — Encounter: Payer: Self-pay | Admitting: Family Medicine

## 2014-06-25 DIAGNOSIS — N183 Chronic kidney disease, stage 3 (moderate): Secondary | ICD-10-CM

## 2014-06-25 DIAGNOSIS — N1832 Chronic kidney disease, stage 3b: Secondary | ICD-10-CM | POA: Insufficient documentation

## 2014-06-25 LAB — COMPLETE METABOLIC PANEL WITH GFR
ALK PHOS: 41 U/L (ref 39–117)
ALT: 11 U/L (ref 0–35)
AST: 17 U/L (ref 0–37)
Albumin: 4.2 g/dL (ref 3.5–5.2)
BUN: 23 mg/dL (ref 6–23)
CO2: 30 mEq/L (ref 19–32)
Calcium: 9.4 mg/dL (ref 8.4–10.5)
Chloride: 102 mEq/L (ref 96–112)
Creat: 1.19 mg/dL — ABNORMAL HIGH (ref 0.50–1.10)
GFR, EST NON AFRICAN AMERICAN: 49 mL/min — AB
GFR, Est African American: 57 mL/min — ABNORMAL LOW
Glucose, Bld: 95 mg/dL (ref 70–99)
POTASSIUM: 4.1 meq/L (ref 3.5–5.3)
Sodium: 139 mEq/L (ref 135–145)
Total Bilirubin: 0.5 mg/dL (ref 0.2–1.2)
Total Protein: 6.3 g/dL (ref 6.0–8.3)

## 2014-06-25 LAB — LIPID PANEL
CHOLESTEROL: 200 mg/dL (ref 0–200)
HDL: 61 mg/dL (ref >=46)
LDL Cholesterol: 111 mg/dL — ABNORMAL HIGH (ref 0–99)
Total CHOL/HDL Ratio: 3.3 Ratio
Triglycerides: 141 mg/dL (ref <150)
VLDL: 28 mg/dL (ref 0–40)

## 2014-07-04 ENCOUNTER — Other Ambulatory Visit: Payer: Self-pay | Admitting: Family Medicine

## 2014-07-09 ENCOUNTER — Other Ambulatory Visit: Payer: Self-pay | Admitting: Family Medicine

## 2014-07-17 ENCOUNTER — Other Ambulatory Visit: Payer: Self-pay | Admitting: Family Medicine

## 2014-07-17 ENCOUNTER — Other Ambulatory Visit: Payer: Self-pay | Admitting: *Deleted

## 2014-07-17 DIAGNOSIS — R7989 Other specified abnormal findings of blood chemistry: Secondary | ICD-10-CM

## 2014-07-19 LAB — BASIC METABOLIC PANEL WITH GFR
BUN: 22 mg/dL (ref 6–23)
CO2: 30 mEq/L (ref 19–32)
CREATININE: 1.19 mg/dL — AB (ref 0.50–1.10)
Calcium: 9.6 mg/dL (ref 8.4–10.5)
Chloride: 101 mEq/L (ref 96–112)
GFR, Est African American: 57 mL/min — ABNORMAL LOW
GFR, Est Non African American: 49 mL/min — ABNORMAL LOW
Glucose, Bld: 96 mg/dL (ref 70–99)
Potassium: 4.1 mEq/L (ref 3.5–5.3)
SODIUM: 141 meq/L (ref 135–145)

## 2014-07-19 LAB — MICROALBUMIN, URINE: Microalb, Ur: 0.3 mg/dL (ref <2.0)

## 2014-07-21 ENCOUNTER — Other Ambulatory Visit: Payer: Self-pay | Admitting: Family Medicine

## 2014-07-28 ENCOUNTER — Encounter: Payer: Self-pay | Admitting: Family Medicine

## 2014-12-18 ENCOUNTER — Other Ambulatory Visit: Payer: Self-pay | Admitting: Family Medicine

## 2014-12-25 ENCOUNTER — Other Ambulatory Visit: Payer: Self-pay | Admitting: Family Medicine

## 2014-12-31 ENCOUNTER — Other Ambulatory Visit: Payer: Self-pay | Admitting: Family Medicine

## 2015-01-03 ENCOUNTER — Other Ambulatory Visit: Payer: Self-pay | Admitting: Family Medicine

## 2015-01-05 ENCOUNTER — Other Ambulatory Visit: Payer: Self-pay | Admitting: Family Medicine

## 2015-01-05 MED ORDER — HYDROCHLOROTHIAZIDE 25 MG PO TABS: 25.0000 mg | ORAL_TABLET | Freq: Every day | ORAL | Status: DC

## 2015-01-08 ENCOUNTER — Other Ambulatory Visit: Payer: Self-pay | Admitting: Family Medicine

## 2015-01-20 ENCOUNTER — Other Ambulatory Visit: Payer: Self-pay | Admitting: Family Medicine

## 2015-01-21 ENCOUNTER — Other Ambulatory Visit: Payer: Self-pay | Admitting: Family Medicine

## 2015-01-30 ENCOUNTER — Other Ambulatory Visit: Payer: Self-pay | Admitting: Family Medicine

## 2015-01-30 MED ORDER — HYDROCHLOROTHIAZIDE 25 MG PO TABS: 25.0000 mg | ORAL_TABLET | Freq: Every day | ORAL | Status: DC

## 2015-02-08 ENCOUNTER — Other Ambulatory Visit: Payer: Self-pay | Admitting: Family Medicine

## 2015-02-09 ENCOUNTER — Encounter: Payer: Self-pay | Admitting: Family Medicine

## 2015-02-09 ENCOUNTER — Ambulatory Visit (INDEPENDENT_AMBULATORY_CARE_PROVIDER_SITE_OTHER): Payer: BLUE CROSS/BLUE SHIELD | Admitting: Family Medicine

## 2015-02-09 ENCOUNTER — Telehealth: Payer: Self-pay | Admitting: Family Medicine

## 2015-02-09 VITALS — BP 140/55 | HR 55 | Wt 165.0 lb

## 2015-02-09 DIAGNOSIS — N183 Chronic kidney disease, stage 3 (moderate): Secondary | ICD-10-CM | POA: Diagnosis not present

## 2015-02-09 DIAGNOSIS — I10 Essential (primary) hypertension: Secondary | ICD-10-CM

## 2015-02-09 DIAGNOSIS — Z1159 Encounter for screening for other viral diseases: Secondary | ICD-10-CM

## 2015-02-09 DIAGNOSIS — N1832 Chronic kidney disease, stage 3b: Secondary | ICD-10-CM

## 2015-02-09 DIAGNOSIS — Z114 Encounter for screening for human immunodeficiency virus [HIV]: Secondary | ICD-10-CM | POA: Diagnosis not present

## 2015-02-09 MED ORDER — METOPROLOL SUCCINATE ER 50 MG PO TB24: ORAL_TABLET | ORAL | Status: DC

## 2015-02-09 MED ORDER — LOSARTAN POTASSIUM 50 MG PO TABS: ORAL_TABLET | ORAL | Status: DC

## 2015-02-09 MED ORDER — HYDROCHLOROTHIAZIDE 25 MG PO TABS: 25.0000 mg | ORAL_TABLET | Freq: Every day | ORAL | Status: DC

## 2015-02-09 NOTE — Telephone Encounter (Signed)
Please call patient: I forgot to give her a lab slip to check her kidney function. Because of the slight impairment that she has we really need to check this every 6 months. She does not need to fast. I also had a screening for hepatitis C and HIV which is recommended based on her current age [facility].

## 2015-02-09 NOTE — Telephone Encounter (Signed)
Called pt back and she would rather go to the lab closer to her home off [address] [place].  I am mailing her the lab slips so she can take with her to the lab when ready.

## 2015-02-09 NOTE — Progress Notes (Signed)
   Subjective:    Patient ID: Stephanie Cross, female    DOB: 07/23/1950, 65 y.o.   MRN: [id number]  HPI Hypertension- Pt denies chest pain, SOB, dizziness, or heart palpitations.  Taking meds as directed w/o problems.  Denies medication side effects.  Currently on 100 chlorothiazide, losartan and metoprolol. She takes Natrecor thiazide losartan the morning and the metoprolol around her evening meal.  Her last Pap smear was in 2011 and doesn't remember when her last mammogram was a history in several years.  Review of Systems     Objective:   Physical Exam  Constitutional: She is oriented to person, place, and time. She appears well-developed and well-nourished.  HENT:  Head: Normocephalic and atraumatic.  Cardiovascular: Normal rate, regular rhythm and normal heart sounds.   Pulmonary/Chest: Effort normal and breath sounds normal.  Neurological: She is alert and oriented to person, place, and time.  Skin: Skin is warm and dry.  Psychiatric: She has a normal mood and affect. Her behavior is normal.          Assessment & Plan:  HTN - repeat blood pressure was just borderline but much better. She says she gets extremely stressed when she drives and that's why she feels very anxious right now. She says normally her blood pressure at home is really well controlled and normally looks great. Refills sent to the pharmacy. Refills sent to pharmacy for 6 months. Follow-up in 6 months.  CKD 3 - due for repeat function evaluation.    Due for pap smear and mammogram. Encouraged her to schedule appt for this.

## 2015-03-28 LAB — BASIC METABOLIC PANEL WITH GFR
BUN: 23 mg/dL (ref 7–25)
CHLORIDE: 102 mmol/L (ref 98–110)
CO2: 28 mmol/L (ref 20–31)
Calcium: 9.5 mg/dL (ref 8.6–10.4)
Creat: 1.17 mg/dL — ABNORMAL HIGH (ref 0.50–0.99)
GFR, Est African American: 57 mL/min — ABNORMAL LOW (ref >=60)
GFR, Est Non African American: 50 mL/min — ABNORMAL LOW (ref >=60)
GLUCOSE: 90 mg/dL (ref 65–99)
POTASSIUM: 4.1 mmol/L (ref 3.5–5.3)
Sodium: 138 mmol/L (ref 135–146)

## 2015-03-28 LAB — HEPATITIS C ANTIBODY: HCV Ab: NEGATIVE

## 2015-03-29 LAB — HIV ANTIBODY (ROUTINE TESTING W REFLEX): HIV 1&2 Ab, 4th Generation: NONREACTIVE

## 2015-07-09 ENCOUNTER — Other Ambulatory Visit: Payer: Self-pay | Admitting: Family Medicine

## 2015-08-12 ENCOUNTER — Other Ambulatory Visit: Payer: Self-pay | Admitting: *Deleted

## 2015-08-12 DIAGNOSIS — I10 Essential (primary) hypertension: Secondary | ICD-10-CM

## 2015-08-12 DIAGNOSIS — N1832 Chronic kidney disease, stage 3b: Secondary | ICD-10-CM

## 2015-08-12 DIAGNOSIS — N183 Chronic kidney disease, stage 3 (moderate): Secondary | ICD-10-CM

## 2015-08-12 MED ORDER — LOSARTAN POTASSIUM 50 MG PO TABS: ORAL_TABLET | ORAL | Status: DC

## 2015-08-12 MED ORDER — METOPROLOL SUCCINATE ER 50 MG PO TB24: ORAL_TABLET | ORAL | Status: DC

## 2015-08-12 NOTE — Telephone Encounter (Signed)
Called and spoke w/pt and informed her that she will need to schedule an appt she stated that she would call back.Loralee PacasBarkley, [name] Purple SageLynetta

## 2015-08-13 ENCOUNTER — Other Ambulatory Visit: Payer: Self-pay | Admitting: *Deleted

## 2015-08-13 DIAGNOSIS — I10 Essential (primary) hypertension: Secondary | ICD-10-CM

## 2015-08-13 MED ORDER — HYDROCHLOROTHIAZIDE 25 MG PO TABS: 25.0000 mg | ORAL_TABLET | Freq: Every day | ORAL | Status: DC

## 2015-11-16 IMAGING — CR DG CHEST 2V
2 series · 2 of 2 positions shown · non-contrast
Comparison: 03/28/2012

CLINICAL DATA: Cough and chest congestion.  Asthma.

EXAM:
CHEST  2 VIEW

[view not recorded (1 of 2)]
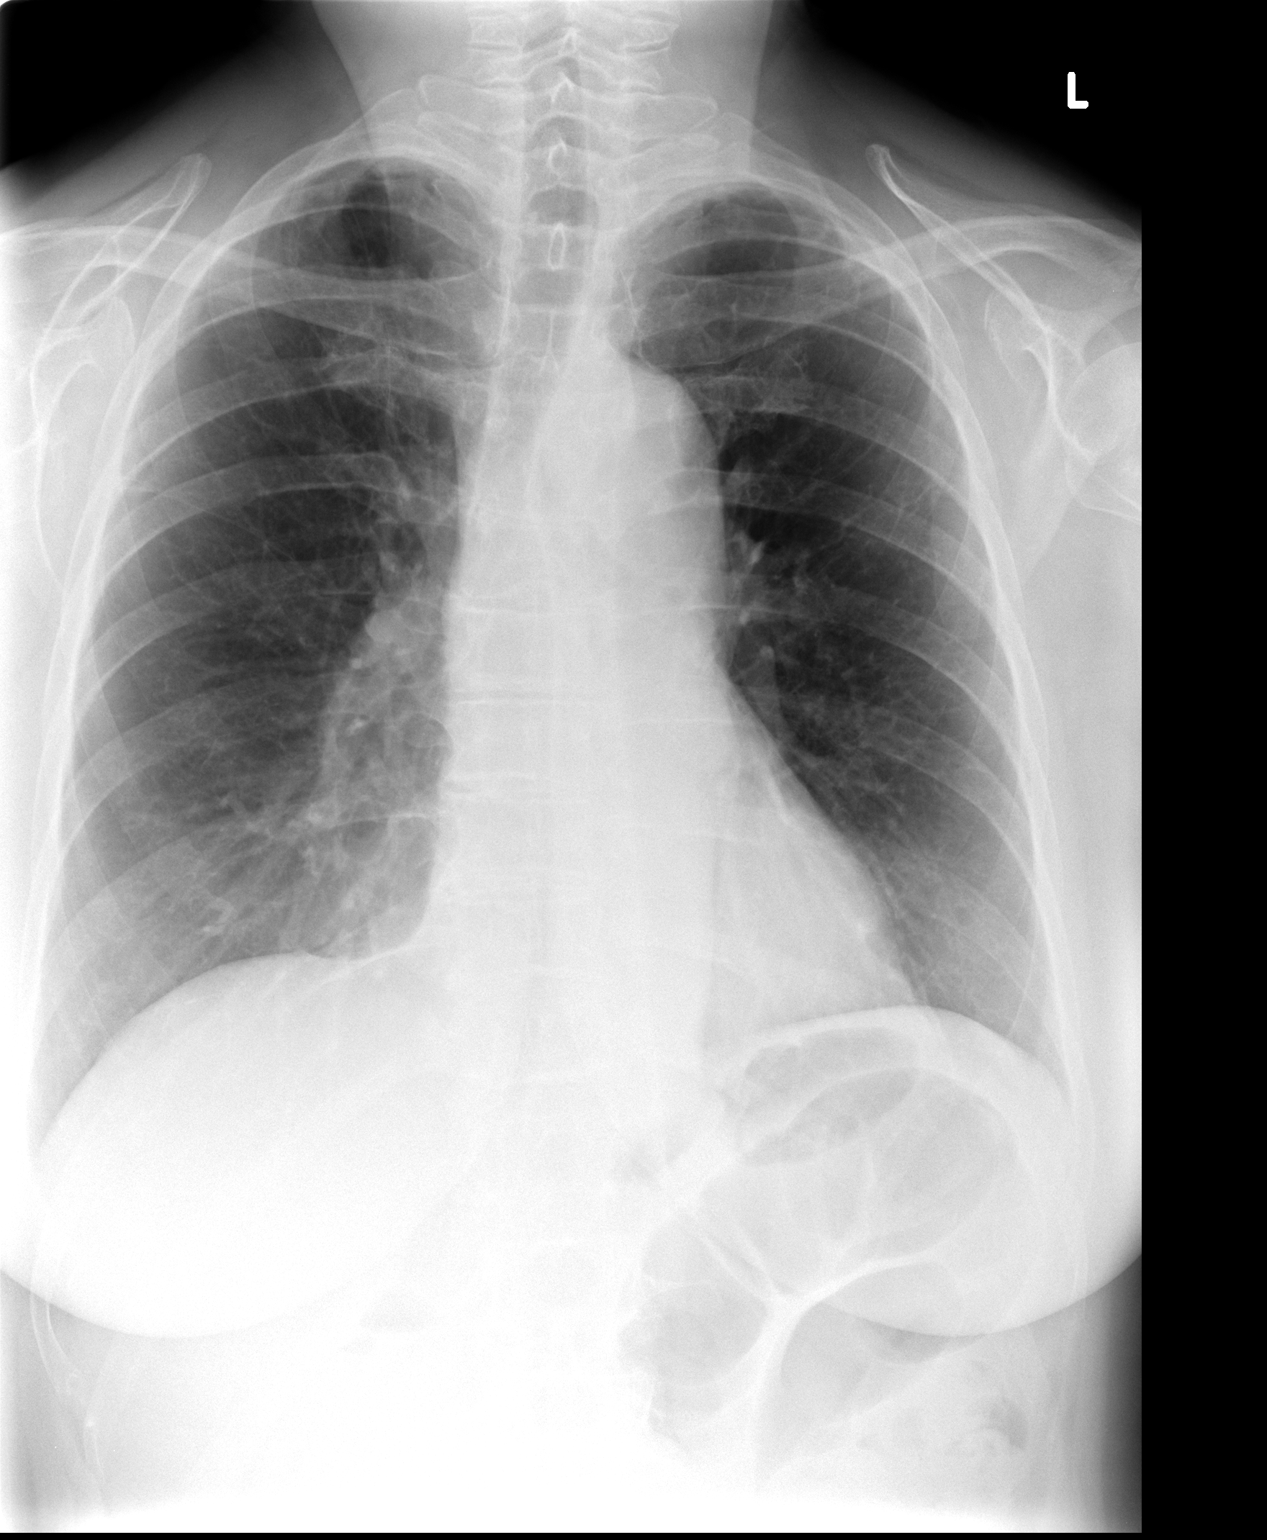

[view not recorded (2 of 2)]
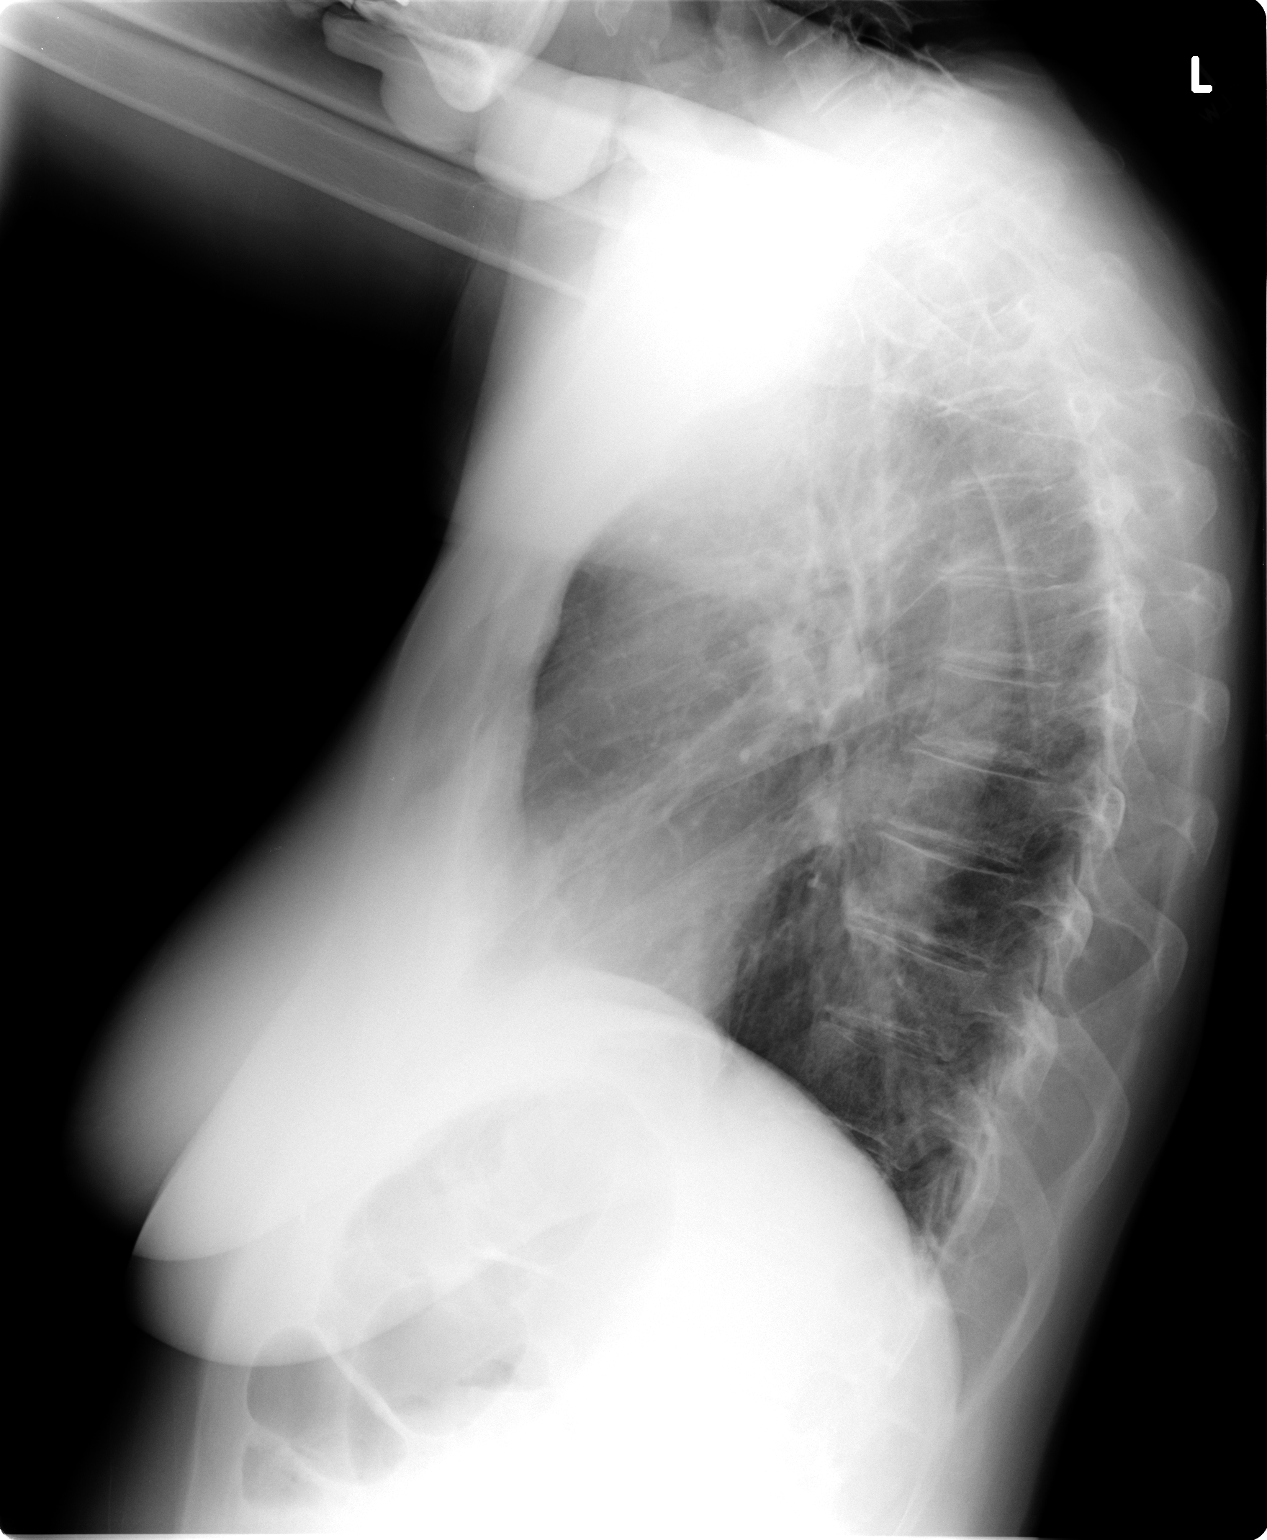

[2 of 2 positions shown; findings below may reference images not displayed]

FINDINGS: The heart size and mediastinal contours are within normal limits.
Both lungs are clear. The visualized skeletal structures are
unremarkable.
IMPRESSION: No active cardiopulmonary disease.

## 2016-02-11 ENCOUNTER — Other Ambulatory Visit: Payer: Self-pay | Admitting: Family Medicine

## 2016-02-11 DIAGNOSIS — N1832 Chronic kidney disease, stage 3b: Secondary | ICD-10-CM

## 2016-02-11 DIAGNOSIS — I10 Essential (primary) hypertension: Secondary | ICD-10-CM

## 2016-02-11 DIAGNOSIS — N183 Chronic kidney disease, stage 3 (moderate): Secondary | ICD-10-CM

## 2016-02-18 ENCOUNTER — Other Ambulatory Visit: Payer: Self-pay

## 2016-02-18 DIAGNOSIS — I10 Essential (primary) hypertension: Secondary | ICD-10-CM

## 2016-02-18 MED ORDER — HYDROCHLOROTHIAZIDE 25 MG PO TABS: 25.0000 mg | ORAL_TABLET | Freq: Every day | ORAL | 0 refills | Status: DC

## 2016-02-25 ENCOUNTER — Encounter: Payer: Self-pay | Admitting: Family Medicine

## 2016-02-25 ENCOUNTER — Ambulatory Visit (INDEPENDENT_AMBULATORY_CARE_PROVIDER_SITE_OTHER): Payer: BLUE CROSS/BLUE SHIELD | Admitting: Family Medicine

## 2016-02-25 VITALS — BP 134/62 | HR 73 | Ht 68.0 in | Wt 170.0 lb

## 2016-02-25 DIAGNOSIS — I10 Essential (primary) hypertension: Secondary | ICD-10-CM | POA: Diagnosis not present

## 2016-02-25 DIAGNOSIS — N183 Chronic kidney disease, stage 3 (moderate): Secondary | ICD-10-CM | POA: Diagnosis not present

## 2016-02-25 DIAGNOSIS — J111 Influenza due to unidentified influenza virus with other respiratory manifestations: Secondary | ICD-10-CM

## 2016-02-25 DIAGNOSIS — R69 Illness, unspecified: Secondary | ICD-10-CM

## 2016-02-25 DIAGNOSIS — N1832 Chronic kidney disease, stage 3b: Secondary | ICD-10-CM

## 2016-02-25 MED ORDER — LOSARTAN POTASSIUM 50 MG PO TABS: ORAL_TABLET | ORAL | 1 refills | Status: DC

## 2016-02-25 MED ORDER — METOPROLOL SUCCINATE ER 50 MG PO TB24: ORAL_TABLET | ORAL | 1 refills | Status: DC

## 2016-02-25 MED ORDER — HYDROCHLOROTHIAZIDE 25 MG PO TABS: 25.0000 mg | ORAL_TABLET | Freq: Every day | ORAL | 1 refills | Status: DC

## 2016-02-25 MED ORDER — BENZONATATE 200 MG PO CAPS: 200.0000 mg | ORAL_CAPSULE | Freq: Two times a day (BID) | ORAL | 0 refills | Status: DC | PRN

## 2016-02-25 MED ORDER — PROMETHAZINE-DM 6.25-15 MG/5ML PO SYRP: 5.0000 mL | ORAL_SOLUTION | Freq: Every evening | ORAL | 0 refills | Status: DC | PRN

## 2016-02-25 NOTE — Progress Notes (Signed)
Subjective:    Patient ID: Stephanie Cross, female    DOB: 05/23/1950, 66 y.o.   MRN: [id number]  HPI Hypertension- Pt denies chest pain, SOB, dizziness, or heart palpitations.  Taking meds as directed w/o problems.  Denies medication side effects.    CKD 3 - No change in urination.    Cough x 4 days, started on Sunday.  Taking Corcidan and Delsym.   Some fatigue and back pain.  Had bronchitis in December.  She has had some sweats and has not actually checked her temperature so not sure she is actually been running 1 or not. 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  Review of Systems  BP 134/62   Pulse 73   Ht 5\' 8"  (1.727 m)   Wt 170 lb (77.1 kg)   SpO2 99%   BMI 25.85 kg/m     Allergies  Allergen Reactions  . Augmentin [Amoxicillin-Pot Clavulanate]     diarrhea  . Lisinopril Cough    Past Medical History:  Diagnosis Date  . Allergy   . Anxiety   . Fungal toenail infection    being treated for 2 weeks ago/03/2012  . Hypertension   . Menopause syndrome     Past Surgical History:  Procedure Laterality Date  . bone spur    . DILATION AND CURETTAGE OF UTERUS    . EXPLORATORY LAPAROTOMY    . miscarriages      2 times  . WISDOM TOOTH EXTRACTION      Social History   Social History  . Marital status: Married    Spouse name: N/A  . Number of children: N/A  . Years of education: N/A   Occupational History  . Not on file.   Social History Main Topics  . Smoking status: Never Smoker  . Smokeless tobacco: Never Used  . Alcohol use 2.4 - 3.6 oz/week    4 - 6 Glasses of wine per week     Comment: occasiona;  Marland Kitchen. Drug use: No  . Sexual activity: Not on file     Comment: admin asst, walks on treadmill 4 X week, married.   Other Topics Concern  . Not on file   Social History Narrative  . No narrative on file    Family History  Problem Relation Age of Onset  . COPD Mother     smoker  . Lung cancer Father     smoker   . Colon cancer Brother     Outpatient Encounter Prescriptions as of 02/25/2016  Medication Sig  . benzonatate (TESSALON) 200 MG capsule Take 1 capsule (200 mg total) by mouth 2 (two) times daily as needed for cough.  . hydrochlorothiazide (HYDRODIURIL) 25 MG tablet Take 1 tablet (25 mg total) by mouth daily.  Marland Kitchen. losartan (COZAAR) 50 MG tablet TAKE 1 TABLET BY MOUTH DAILY  . metoprolol succinate (TOPROL-XL) 50 MG 24 hr tablet Take 1 tablet by mouth daily.  Take WITH or IMMEDIATELY following a meal.  . Multiple Vitamin (MULTIVITAMIN) tablet Take by mouth. MultiVites-Take 2 daily  . PROAIR HFA 108 (90 Base) MCG/ACT inhaler INHALE 2 PUFFS INTO THE LUNGS EVERY 6 (SIX) HOURS AS NEEDED FOR WHEEZING OR SHORTNESS OF BREATH.  . promethazine-dextromethorphan (PROMETHAZINE-DM) 6.25-15 MG/5ML syrup Take 5 mLs by mouth at bedtime as needed for cough.  . [DISCONTINUED] hydrochlorothiazide (HYDRODIURIL) 25 MG tablet Take 1 tablet (25 mg total) by mouth daily.  . [  DISCONTINUED] losartan (COZAAR) 50 MG tablet TAKE 1 TABLET BY MOUTH DAILY  . [DISCONTINUED] metoprolol succinate (TOPROL-XL) 50 MG 24 hr tablet Take 1 tablet by mouth daily.  Take WITH or IMMEDIATELY following a meal.   No facility-administered encounter medications on file as of 02/25/2016.          Objective:   Physical Exam  Constitutional: She is oriented to person, place, and time. She appears well-developed and well-nourished.  HENT:  Head: Normocephalic and atraumatic.  Cardiovascular: Normal rate, regular rhythm and normal heart sounds.   Pulmonary/Chest: Effort normal and breath sounds normal.  Neurological: She is alert and oriented to person, place, and time.  Skin: Skin is warm and dry.  Psychiatric: She has a normal mood and affect. Her behavior is normal.      Assessment & Plan:  HTN - Controlled. Continue current regimen.  CKD 3 - due for repeat kidney check.  Influenza-like illness-though no documented fever.  Though it sounds like she may have been having some febrile type symptoms she just never measured it. She is at the 72 hour window for Tamiflu so would potentially be a candidate.

## 2016-05-02 LAB — COMPLETE METABOLIC PANEL WITH GFR
ALBUMIN: 4.1 g/dL (ref 3.6–5.1)
ALK PHOS: 45 U/L (ref 33–130)
ALT: 14 U/L (ref 6–29)
AST: 16 U/L (ref 10–35)
BILIRUBIN TOTAL: 0.5 mg/dL (ref 0.2–1.2)
BUN: 25 mg/dL (ref 7–25)
CO2: 31 mmol/L (ref 20–31)
CREATININE: 1.22 mg/dL — AB (ref 0.50–0.99)
Calcium: 9.3 mg/dL (ref 8.6–10.4)
Chloride: 104 mmol/L (ref 98–110)
GFR, EST AFRICAN AMERICAN: 54 mL/min — AB (ref >=60)
GFR, EST NON AFRICAN AMERICAN: 47 mL/min — AB (ref >=60)
Glucose, Bld: 82 mg/dL (ref 65–99)
Potassium: 4 mmol/L (ref 3.5–5.3)
Sodium: 141 mmol/L (ref 135–146)
TOTAL PROTEIN: 6.4 g/dL (ref 6.1–8.1)

## 2016-05-02 LAB — LIPID PANEL
Cholesterol: 215 mg/dL — ABNORMAL HIGH (ref <200)
HDL: 75 mg/dL (ref >50)
LDL Cholesterol: 120 mg/dL — ABNORMAL HIGH (ref <100)
TRIGLYCERIDES: 101 mg/dL (ref <150)
Total CHOL/HDL Ratio: 2.9 Ratio (ref <5.0)
VLDL: 20 mg/dL (ref <30)

## 2016-05-11 ENCOUNTER — Other Ambulatory Visit: Payer: Self-pay | Admitting: *Deleted

## 2016-05-11 DIAGNOSIS — R7989 Other specified abnormal findings of blood chemistry: Secondary | ICD-10-CM

## 2016-08-22 ENCOUNTER — Other Ambulatory Visit: Payer: Self-pay | Admitting: Family Medicine

## 2016-08-22 DIAGNOSIS — I10 Essential (primary) hypertension: Secondary | ICD-10-CM

## 2016-08-27 ENCOUNTER — Other Ambulatory Visit: Payer: Self-pay | Admitting: Family Medicine

## 2016-08-27 DIAGNOSIS — I10 Essential (primary) hypertension: Secondary | ICD-10-CM

## 2016-09-09 ENCOUNTER — Encounter: Payer: Self-pay | Admitting: Family Medicine

## 2016-09-09 ENCOUNTER — Ambulatory Visit (INDEPENDENT_AMBULATORY_CARE_PROVIDER_SITE_OTHER): Payer: BLUE CROSS/BLUE SHIELD | Admitting: Family Medicine

## 2016-09-09 VITALS — BP 139/66 | HR 66 | Wt 169.0 lb

## 2016-09-09 DIAGNOSIS — J4520 Mild intermittent asthma, uncomplicated: Secondary | ICD-10-CM

## 2016-09-09 DIAGNOSIS — Z23 Encounter for immunization: Secondary | ICD-10-CM | POA: Diagnosis not present

## 2016-09-09 DIAGNOSIS — N1832 Chronic kidney disease, stage 3b: Secondary | ICD-10-CM

## 2016-09-09 DIAGNOSIS — R7989 Other specified abnormal findings of blood chemistry: Secondary | ICD-10-CM | POA: Diagnosis not present

## 2016-09-09 DIAGNOSIS — N183 Chronic kidney disease, stage 3 (moderate): Secondary | ICD-10-CM | POA: Diagnosis not present

## 2016-09-09 DIAGNOSIS — I10 Essential (primary) hypertension: Secondary | ICD-10-CM | POA: Diagnosis not present

## 2016-09-09 LAB — BASIC METABOLIC PANEL WITH GFR
BUN: 24 mg/dL (ref 7–25)
CALCIUM: 9.7 mg/dL (ref 8.6–10.4)
CO2: 20 mmol/L (ref 20–32)
CREATININE: 1.22 mg/dL — AB (ref 0.50–0.99)
Chloride: 100 mmol/L (ref 98–110)
GFR, EST AFRICAN AMERICAN: 54 mL/min — AB (ref >=60)
GFR, EST NON AFRICAN AMERICAN: 47 mL/min — AB (ref >=60)
Glucose, Bld: 88 mg/dL (ref 65–99)
Potassium: 4.3 mmol/L (ref 3.5–5.3)
SODIUM: 138 mmol/L (ref 135–146)

## 2016-09-09 MED ORDER — METOPROLOL SUCCINATE ER 50 MG PO TB24: 50.0000 mg | ORAL_TABLET | Freq: Every day | ORAL | 1 refills | Status: DC

## 2016-09-09 MED ORDER — HYDROCHLOROTHIAZIDE 25 MG PO TABS: 25.0000 mg | ORAL_TABLET | Freq: Every day | ORAL | 1 refills | Status: DC

## 2016-09-09 MED ORDER — LOSARTAN POTASSIUM 50 MG PO TABS: 50.0000 mg | ORAL_TABLET | Freq: Every day | ORAL | 1 refills | Status: DC

## 2016-09-09 NOTE — Progress Notes (Signed)
Subjective:    Patient ID: Stephanie Cross, female    DOB: [birth year], 66 y.o.   MRN: [id number]  HPI  Hypertension- Pt denies chest pain, SOB, dizziness, or heart palpitations.  Taking meds as directed w/o problems.  Denies medication side effects.    She still has a little bit of intermittent cough. We have actually changed her ACE inhibitor to an ARB and it did get better but didn't completely go away. She notices it happens more when she bends over. It happens maybe a few times a week.  Asthma-well controlled overall. Occasionally uses albuterol but very infrequently. She says the last time she is to prior actually, caused her throat to burn. Has not used it again since then.  Review of Systems  BP (!) 153/61   Pulse 66   Wt 169 lb (76.7 kg)   BMI 25.70 kg/m     Allergies  Allergen Reactions  . Augmentin [Amoxicillin-Pot Clavulanate]     diarrhea  . Lisinopril Cough    Past Medical History:  Diagnosis Date  . Allergy   . Anxiety   . Fungal toenail infection    being treated for 2 weeks ago/03/2012  . Hypertension   . Menopause syndrome     Past Surgical History:  Procedure Laterality Date  . bone spur    . DILATION AND CURETTAGE OF UTERUS    . EXPLORATORY LAPAROTOMY    . miscarriages      2 times  . WISDOM TOOTH EXTRACTION      Social History   Social History  . Marital status: Married    Spouse name: N/A  . Number of children: N/A  . Years of education: N/A   Occupational History  . Not on file.   Social History Main Topics  . Smoking status: Never Smoker  . Smokeless tobacco: Never Used  . Alcohol use 2.4 - 3.6 oz/week    4 - 6 Glasses of wine per week     Comment: occasiona;  Marland Kitchen Drug use: No  . Sexual activity: Not on file     Comment: admin asst, walks on treadmill 4 X week, married.   Other Topics Concern  . Not on file   Social History Narrative  . No narrative on file    Family History  Problem Relation Age of Onset  . COPD Mother         smoker  . Lung cancer Father        smoker   . Colon cancer Brother     Outpatient Encounter Prescriptions as of 09/09/2016  Medication Sig  . hydrochlorothiazide (HYDRODIURIL) 25 MG tablet Take 1 tablet (25 mg total) by mouth daily. Due for follow up  . losartan (COZAAR) 50 MG tablet Take 1 tablet (50 mg total) by mouth daily. Due for follow up visit  . metoprolol succinate (TOPROL-XL) 50 MG 24 hr tablet Take 1 tablet (50 mg total) by mouth daily. Take WITH or IMMEDIATELY following a meal. Due for follow up visit  . Multiple Vitamin (MULTIVITAMIN) tablet Take by mouth. MultiVites-Take 2 daily  . PROAIR HFA 108 (90 Base) MCG/ACT inhaler INHALE 2 PUFFS INTO THE LUNGS EVERY 6 (SIX) HOURS AS NEEDED FOR WHEEZING OR SHORTNESS OF BREATH. (Patient not taking: Reported on 09/09/2016)  . [DISCONTINUED] benzonatate (TESSALON) 200 MG capsule Take 1 capsule (200 mg total) by mouth 2 (two) times daily as needed for cough.  . [DISCONTINUED] promethazine-dextromethorphan (PROMETHAZINE-DM) 6.25-15 MG/5ML syrup Take 5  mLs by mouth at bedtime as needed for cough.   No facility-administered encounter medications on file as of 09/09/2016.           Objective:   Physical Exam  Constitutional: She is oriented to person, place, and time. She appears well-developed and well-nourished.  HENT:  Head: Normocephalic and atraumatic.  Cardiovascular: Normal rate, regular rhythm and normal heart sounds.   Pulmonary/Chest: Effort normal and breath sounds normal.  Neurological: She is alert and oriented to person, place, and time.  Skin: Skin is warm and dry.  Psychiatric: She has a normal mood and affect. Her behavior is normal.        Assessment & Plan:  HTN  -   Well controlled. Continue current regimen. Follow up in  6 months.   Intermittent cough-suspect that it could be reflux versus postnasal drip. Recommend trial of PPI for 4-6 weeks. If not helpful then consider treatment for postnasal drip  using a nasal steroid spray and nasal rinses. She will let me know.  Also discussed the importance of getting her mammogram and Pap smear.  Given Prevnar 13 today.  Asthma, stable-mild intermittent-she does not need any refills today.  CKD 3-reviewed lab results with her today. Would like to get renal ultrasound ordered.

## 2016-11-18 ENCOUNTER — Other Ambulatory Visit: Payer: Self-pay | Admitting: Family Medicine

## 2017-01-24 HISTORY — PX: GLAUCOMA SURGERY: SHX656

## 2017-03-13 ENCOUNTER — Ambulatory Visit: Payer: Medicare Other | Admitting: Family Medicine

## 2017-03-19 ENCOUNTER — Other Ambulatory Visit: Payer: Self-pay | Admitting: Family Medicine

## 2017-03-19 DIAGNOSIS — I10 Essential (primary) hypertension: Secondary | ICD-10-CM

## 2017-03-29 ENCOUNTER — Ambulatory Visit: Payer: Medicare Other | Admitting: Family Medicine

## 2017-04-24 ENCOUNTER — Encounter: Payer: Self-pay | Admitting: Gastroenterology

## 2017-05-23 ENCOUNTER — Other Ambulatory Visit: Payer: Self-pay | Admitting: Family Medicine

## 2017-05-23 DIAGNOSIS — I10 Essential (primary) hypertension: Secondary | ICD-10-CM

## 2017-06-22 ENCOUNTER — Other Ambulatory Visit: Payer: Self-pay | Admitting: Family Medicine

## 2017-06-22 DIAGNOSIS — I10 Essential (primary) hypertension: Secondary | ICD-10-CM

## 2017-07-09 ENCOUNTER — Other Ambulatory Visit: Payer: Self-pay | Admitting: Family Medicine

## 2017-07-09 DIAGNOSIS — I10 Essential (primary) hypertension: Secondary | ICD-10-CM

## 2017-07-14 ENCOUNTER — Telehealth: Payer: Self-pay

## 2017-07-14 DIAGNOSIS — I10 Essential (primary) hypertension: Secondary | ICD-10-CM

## 2017-07-14 DIAGNOSIS — N183 Chronic kidney disease, stage 3 (moderate): Secondary | ICD-10-CM

## 2017-07-14 DIAGNOSIS — N1832 Chronic kidney disease, stage 3b: Secondary | ICD-10-CM

## 2017-07-14 NOTE — Telephone Encounter (Signed)
Called pt back- advised her that she was only having her BP check done- not a physical. She states she wants to come in Monday AM to have her Basic Metabolic Panel rechecked prior to her appt so she can have it done fasting. She is aware it will not be resulted by her appt time. Last noted from labs in August 2018 states "Call patient: Kidney function is stable from 4 months ago. Recommend recheck in 4-6 months again"  Orders entered for pt to go downstairs. Pt aware that if provider decides she wants additional labs during appt, she will have to return to lab.

## 2017-07-14 NOTE — Telephone Encounter (Signed)
Pt has appt on Monday and is requesting lab work to be entered prior to appt.   Please enter if ok. Thanks!

## 2017-07-17 ENCOUNTER — Encounter: Payer: Self-pay | Admitting: Family Medicine

## 2017-07-17 ENCOUNTER — Ambulatory Visit (INDEPENDENT_AMBULATORY_CARE_PROVIDER_SITE_OTHER): Payer: BLUE CROSS/BLUE SHIELD | Admitting: Family Medicine

## 2017-07-17 VITALS — BP 138/54 | HR 72 | Ht 68.0 in | Wt 171.0 lb

## 2017-07-17 DIAGNOSIS — H40213 Acute angle-closure glaucoma, bilateral: Secondary | ICD-10-CM | POA: Insufficient documentation

## 2017-07-17 DIAGNOSIS — N1832 Chronic kidney disease, stage 3b: Secondary | ICD-10-CM

## 2017-07-17 DIAGNOSIS — R252 Cramp and spasm: Secondary | ICD-10-CM

## 2017-07-17 DIAGNOSIS — I10 Essential (primary) hypertension: Secondary | ICD-10-CM

## 2017-07-17 DIAGNOSIS — K2100 Gastro-esophageal reflux disease with esophagitis, without bleeding: Secondary | ICD-10-CM

## 2017-07-17 DIAGNOSIS — N183 Chronic kidney disease, stage 3 (moderate): Secondary | ICD-10-CM

## 2017-07-17 MED ORDER — HYDROCHLOROTHIAZIDE 25 MG PO TABS: 25.0000 mg | ORAL_TABLET | Freq: Every day | ORAL | 1 refills | Status: DC

## 2017-07-17 MED ORDER — ALBUTEROL SULFATE HFA 108 (90 BASE) MCG/ACT IN AERS: INHALATION_SPRAY | RESPIRATORY_TRACT | 1 refills | Status: DC

## 2017-07-17 NOTE — Patient Instructions (Signed)
Please remember to schedule your mammogram this summer.  Try to wean off the Nexium to every other day for a couple of weeks and if you are doing well at that point then try to come off if you are able.  Try to maintain with good dietary choices and avoiding greasy, spicy and acidic foods.  Will call with lab results to see if any of the electrolyte's could be causing some leg cramping.  Work on gentle stretching, good hydration and potassium rich foods.

## 2017-07-17 NOTE — Progress Notes (Signed)
Subjective:    Patient ID: Stephanie Cross, female    DOB: 04/02/1950, 67 y.o.   MRN: [id number]  HPI  Hypertension- Pt denies chest pain, SOB, dizziness, or heart palpitations.  Taking meds as directed w/o problems.  Denies medication side effects.    CKD 3-due to recheck labs.  She is on an ARB and diuretic.  She does report that she is been having some more muscle cramping recently.  Mostly at night mostly in her feet.  Nowhere else in the body.  She does notice it happens more when she is been really active.  She recently retired and has been working out in the yard a lot.  In regards to her reflux she did start Nexium and it has been very helpful.  She was noticing that when she would bend over she was started to get coughing fits.  She wanted to know how long she should take the Nexium.  She also was recently diagnosed with glaucoma and has had surgery on both eyes.  She is currently on some prednisone eyedrops for her left eye.  She is doing well and recovering well.  Review of Systems  BP (!) 145/68   Pulse 72   Ht 5\' 8"  (1.727 m)   Wt 171 lb (77.6 kg)   SpO2 100%   BMI 26.00 kg/m     Allergies  Allergen Reactions  . Augmentin [Amoxicillin-Pot Clavulanate]     diarrhea  . Lisinopril Cough    Past Medical History:  Diagnosis Date  . Allergy   . Anxiety   . Fungal toenail infection    being treated for 2 weeks ago/03/2012  . Hypertension   . Menopause syndrome     Past Surgical History:  Procedure Laterality Date  . bone spur    . DILATION AND CURETTAGE OF UTERUS    . EXPLORATORY LAPAROTOMY    . GLAUCOMA SURGERY Bilateral 2019  . miscarriages      2 times  . WISDOM TOOTH EXTRACTION      Social History   Socioeconomic History  . Marital status: Married    Spouse name: Not on file  . Number of children: Not on file  . Years of education: Not on file  . Highest education level: Not on file  Occupational History  . Occupation: Retired IT sales professionalTeacher    Social Needs  . Financial resource strain: Not on file  . Food insecurity:    Worry: Not on file    Inability: Not on file  . Transportation needs:    Medical: Not on file    Non-medical: Not on file  Tobacco Use  . Smoking status: Never Smoker  . Smokeless tobacco: Never Used  Substance and Sexual Activity  . Alcohol use: Yes    Alcohol/week: 2.4 - 3.6 oz    Types: 4 - 6 Glasses of wine per week    Comment: occasiona;  Marland Kitchen. Drug use: No  . Sexual activity: Not on file    Comment: admin asst, walks on treadmill 4 X week, married.  Lifestyle  . Physical activity:    Days per week: Not on file    Minutes per session: Not on file  . Stress: Not on file  Relationships  . Social connections:    Talks on phone: Not on file    Gets together: Not on file    Attends religious service: Not on file    Active member of club or organization: Not on  file    Attends meetings of clubs or organizations: Not on file    Relationship status: Not on file  . Intimate partner violence:    Fear of current or ex partner: Not on file    Emotionally abused: Not on file    Physically abused: Not on file    Forced sexual activity: Not on file  Other Topics Concern  . Not on file  Social History Narrative  . Not on file    Family History  Problem Relation Age of Onset  . COPD Mother        smoker  . Lung cancer Father        smoker   . Colon cancer Brother     Outpatient Encounter Medications as of 07/17/2017  Medication Sig  . albuterol (PROAIR HFA) 108 (90 Base) MCG/ACT inhaler INHALE 2 PUFFS INTO THE LUNGS EVERY 6 (SIX) HOURS AS NEEDED FOR WHEEZING OR SHORTNESS OF BREATH.  . Esomeprazole Magnesium (NEXIUM PO) Take 1 tablet by mouth.  . hydrochlorothiazide (HYDRODIURIL) 25 MG tablet Take 1 tablet (25 mg total) by mouth daily.  Marland Kitchen losartan (COZAAR) 50 MG tablet TAKE 1 TABLET BY MOUTH EVERY DAY  . metoprolol succinate (TOPROL-XL) 50 MG 24 hr tablet TAKE 1 TABLET BY MOUTH EVERY DAY  .  Multiple Vitamin (MULTIVITAMIN) tablet Take by mouth. MultiVites-Take 2 daily  . [DISCONTINUED] albuterol (PROAIR HFA) 108 (90 Base) MCG/ACT inhaler INHALE 2 PUFFS INTO THE LUNGS EVERY 6 (SIX) HOURS AS NEEDED FOR WHEEZING OR SHORTNESS OF BREATH.  . [DISCONTINUED] hydrochlorothiazide (HYDRODIURIL) 25 MG tablet Take 1 tablet (25 mg total) by mouth daily. LAST REFILL.MUST SCHEDULE AND KEEP APPOINTMENT FOR REFILLS.   No facility-administered encounter medications on file as of 07/17/2017.        Objective:   Physical Exam  Constitutional: She is oriented to person, place, and time. She appears well-developed and well-nourished.  HENT:  Head: Normocephalic and atraumatic.  Cardiovascular: Normal rate, regular rhythm and normal heart sounds.  Pulmonary/Chest: Effort normal and breath sounds normal.  Neurological: She is alert and oriented to person, place, and time.  Skin: Skin is warm and dry.  Psychiatric: She has a normal mood and affect. Her behavior is normal.       Assessment & Plan:  HTN - Well controlled. Continue current regimen. Follow up in  12 months.    CKD 3- due to recheck labs.   Muscle cramping -we discussed triggers can include dehydration, increased activity, even shoewear.  She can certainly work on some gentle stretches and pay attention to shoewear.  She actually feels that she does a really good job and hydrating.  In addition she is on a diuretic so it could be affecting her potassium level so we will recheck that today.  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  She is doing really well and finishing up some prednisone drops.  Did add this to her history.  Reminded her to schedule her mammogram and she is overdue.  She just received a letter to repeat her colonoscopy she is due for 5-year recall and says she plans to schedule that soon  GERD-continue with Nexium but did encourage her to start weaning it soon.  We discussed long-term risks of PPI use.

## 2017-07-19 LAB — BASIC METABOLIC PANEL WITH GFR
BUN / CREAT RATIO: 18 (calc) (ref 6–22)
BUN: 21 mg/dL (ref 7–25)
CHLORIDE: 102 mmol/L (ref 98–110)
CO2: 30 mmol/L (ref 20–32)
Calcium: 9.7 mg/dL (ref 8.6–10.4)
Creat: 1.18 mg/dL — ABNORMAL HIGH (ref 0.50–0.99)
GFR, EST AFRICAN AMERICAN: 56 mL/min/{1.73_m2} — AB (ref >=60)
GFR, Est Non African American: 48 mL/min/{1.73_m2} — ABNORMAL LOW (ref >=60)
Glucose, Bld: 98 mg/dL (ref 65–99)
POTASSIUM: 3.9 mmol/L (ref 3.5–5.3)
SODIUM: 139 mmol/L (ref 135–146)

## 2017-07-19 LAB — LIPID PANEL
CHOLESTEROL: 245 mg/dL — AB (ref <200)
HDL: 71 mg/dL (ref >50)
LDL Cholesterol (Calc): 148 mg/dL (calc) — ABNORMAL HIGH
Non-HDL Cholesterol (Calc): 174 mg/dL (calc) — ABNORMAL HIGH (ref <130)
Total CHOL/HDL Ratio: 3.5 (calc) (ref <5.0)
Triglycerides: 131 mg/dL (ref <150)

## 2017-09-06 ENCOUNTER — Other Ambulatory Visit: Payer: Self-pay | Admitting: Family Medicine

## 2017-09-06 DIAGNOSIS — I10 Essential (primary) hypertension: Secondary | ICD-10-CM

## 2017-11-03 ENCOUNTER — Ambulatory Visit (INDEPENDENT_AMBULATORY_CARE_PROVIDER_SITE_OTHER): Payer: BLUE CROSS/BLUE SHIELD | Admitting: Family Medicine

## 2017-11-03 VITALS — Temp 97.8°F

## 2017-11-03 DIAGNOSIS — Z23 Encounter for immunization: Secondary | ICD-10-CM | POA: Diagnosis not present

## 2017-11-03 NOTE — Progress Notes (Signed)
   Subjective:    Patient ID: Stephanie Cross, female    DOB: 1951/01/06, 67 y.o.   MRN: [id number]  HPI  Stephanie Cross is here for flu and pneumonia vaccine.   Review of Systems     Objective:   Physical Exam        Assessment & Plan:  Patient tolerated injection well without complications.

## 2017-12-11 ENCOUNTER — Telehealth: Payer: Self-pay | Admitting: *Deleted

## 2017-12-11 DIAGNOSIS — I10 Essential (primary) hypertension: Secondary | ICD-10-CM

## 2017-12-11 MED ORDER — LOSARTAN POTASSIUM 100 MG PO TABS
50.0000 mg | ORAL_TABLET | Freq: Every day | ORAL | 1 refills | Status: DC
Start: 2017-12-11 — End: 2017-12-15

## 2017-12-11 NOTE — Addendum Note (Signed)
Addended by: Deno EtienneBARKLEY, Leana Springston L on: 12/11/2017 12:32 PM   Modules accepted: Orders

## 2017-12-11 NOTE — Telephone Encounter (Signed)
Losartan potassium 50 mg is on backorder. They h

## 2017-12-11 NOTE — Telephone Encounter (Signed)
Losartan potassium 50 mg is on backorder, they have 25 mg and 100 mg. Ok to send 100 mg for pt to cut in half.Marland Kitchen.Marland Kitchen.Stephanie Cross, Stephanie Cross, CMA

## 2017-12-15 ENCOUNTER — Other Ambulatory Visit: Payer: Self-pay | Admitting: *Deleted

## 2017-12-15 DIAGNOSIS — I10 Essential (primary) hypertension: Secondary | ICD-10-CM

## 2017-12-15 MED ORDER — LOSARTAN POTASSIUM 100 MG PO TABS: ORAL_TABLET | ORAL | 1 refills | Status: DC

## 2018-01-15 ENCOUNTER — Other Ambulatory Visit: Payer: Self-pay | Admitting: Family Medicine

## 2018-01-15 DIAGNOSIS — I10 Essential (primary) hypertension: Secondary | ICD-10-CM

## 2018-03-08 ENCOUNTER — Other Ambulatory Visit: Payer: Self-pay | Admitting: Family Medicine

## 2018-03-08 DIAGNOSIS — I10 Essential (primary) hypertension: Secondary | ICD-10-CM

## 2018-05-02 ENCOUNTER — Encounter: Payer: Self-pay | Admitting: *Deleted

## 2018-05-02 ENCOUNTER — Other Ambulatory Visit: Payer: Self-pay | Admitting: *Deleted

## 2018-05-02 DIAGNOSIS — Z1231 Encounter for screening mammogram for malignant neoplasm of breast: Secondary | ICD-10-CM

## 2018-06-01 ENCOUNTER — Other Ambulatory Visit: Payer: Self-pay | Admitting: Family Medicine

## 2018-06-01 DIAGNOSIS — I10 Essential (primary) hypertension: Secondary | ICD-10-CM

## 2018-06-24 ENCOUNTER — Other Ambulatory Visit: Payer: Self-pay | Admitting: Family Medicine

## 2018-06-24 DIAGNOSIS — I10 Essential (primary) hypertension: Secondary | ICD-10-CM

## 2018-07-04 ENCOUNTER — Other Ambulatory Visit: Payer: Self-pay | Admitting: Family Medicine

## 2018-07-04 DIAGNOSIS — I10 Essential (primary) hypertension: Secondary | ICD-10-CM

## 2018-07-07 ENCOUNTER — Other Ambulatory Visit: Payer: Self-pay | Admitting: Family Medicine

## 2018-07-07 DIAGNOSIS — I10 Essential (primary) hypertension: Secondary | ICD-10-CM

## 2018-08-04 ENCOUNTER — Other Ambulatory Visit: Payer: Self-pay | Admitting: Family Medicine

## 2018-08-04 DIAGNOSIS — I10 Essential (primary) hypertension: Secondary | ICD-10-CM

## 2018-09-10 ENCOUNTER — Telehealth (INDEPENDENT_AMBULATORY_CARE_PROVIDER_SITE_OTHER): Payer: BC Managed Care – PPO | Admitting: Family Medicine

## 2018-09-10 ENCOUNTER — Encounter: Payer: Self-pay | Admitting: Family Medicine

## 2018-09-10 VITALS — BP 132/72 | HR 50 | Ht 68.0 in

## 2018-09-10 DIAGNOSIS — Z13220 Encounter for screening for lipoid disorders: Secondary | ICD-10-CM

## 2018-09-10 DIAGNOSIS — R053 Chronic cough: Secondary | ICD-10-CM

## 2018-09-10 DIAGNOSIS — R05 Cough: Secondary | ICD-10-CM | POA: Diagnosis not present

## 2018-09-10 DIAGNOSIS — I10 Essential (primary) hypertension: Secondary | ICD-10-CM | POA: Diagnosis not present

## 2018-09-10 DIAGNOSIS — N183 Chronic kidney disease, stage 3 (moderate): Secondary | ICD-10-CM

## 2018-09-10 DIAGNOSIS — N1832 Chronic kidney disease, stage 3b: Secondary | ICD-10-CM

## 2018-09-10 MED ORDER — LOSARTAN POTASSIUM-HCTZ 100-25 MG PO TABS: 1.0000 | ORAL_TABLET | Freq: Every day | ORAL | 3 refills | Status: DC

## 2018-09-10 MED ORDER — LOSARTAN POTASSIUM 100 MG PO TABS: ORAL_TABLET | ORAL | 1 refills | Status: DC

## 2018-09-10 MED ORDER — HYDROCHLOROTHIAZIDE 25 MG PO TABS: 25.0000 mg | ORAL_TABLET | Freq: Every day | ORAL | 1 refills | Status: DC

## 2018-09-10 MED ORDER — METOPROLOL SUCCINATE ER 50 MG PO TB24: 50.0000 mg | ORAL_TABLET | Freq: Every day | ORAL | 1 refills | Status: DC

## 2018-09-10 NOTE — Assessment & Plan Note (Addendum)
BP well controlled. Try to shoot for SBP less than 130. Due for labs.  F/u in 6 mo. Combine losartan and HCTZ.

## 2018-09-10 NOTE — Assessment & Plan Note (Signed)
Due for recheck renal function.

## 2018-09-10 NOTE — Progress Notes (Signed)
Virtual Visit via Video Note  I connected with Stephanie Cross on 09/10/18 at  8:50 AM EDT by a video enabled telemedicine application and verified that I am speaking with the correct person using two identifiers.   I discussed the limitations of evaluation and management by telemedicine and the availability of in person appointments. The patient expressed understanding and agreed to proceed.     Established Patient Office Visit  Subjective:  Patient ID: Stephanie Cross, female    DOB: October 19, 1950  Age: 68 y.o. MRN: [id number]  CC:  Chief Complaint  Patient presents with  . Hypertension    HPI Stephanie Cross presents for  Hypertension- Pt denies chest pain, SOB, dizziness, or heart palpitations.  Taking meds as directed w/o problems.  Denies medication side effects.  Has been walking 2 miles per day.  Eating better.   F/U CKD - no changes in urination.  Due to recheck renal function.    Sometimes getting a random cough.  Has tried GERD medication. sometime will happen out of the blue, esp when outside. Notices that sneezing can trigger the cough. She definitely felt it was better off the lisinopril.  Though she still has some cough.  Reflux med seems to help some but was nervous to take it regularly.  No SOB.    Past Medical History:  Diagnosis Date  . Allergy   . Anxiety   . Fungal toenail infection    being treated for 2 weeks ago/03/2012  . Hypertension   . Menopause syndrome     Past Surgical History:  Procedure Laterality Date  . bone spur    . DILATION AND CURETTAGE OF UTERUS    . EXPLORATORY LAPAROTOMY    . GLAUCOMA SURGERY Bilateral 2019  . miscarriages      2 times  . WISDOM TOOTH EXTRACTION      Family History  Problem Relation Age of Onset  . COPD Mother        smoker  . Lung cancer Father        smoker   . Colon cancer Brother     Social History   Socioeconomic History  . Marital status: Married    Spouse name: Not on file  . Number of children:  Not on file  . Years of education: Not on file  . Highest education level: Not on file  Occupational History  . Occupation: Retired Management consultant  . Financial resource strain: Not on file  . Food insecurity    Worry: Not on file    Inability: Not on file  . Transportation needs    Medical: Not on file    Non-medical: Not on file  Tobacco Use  . Smoking status: Never Smoker  . Smokeless tobacco: Never Used  Substance and Sexual Activity  . Alcohol use: Yes    Alcohol/week: 4.0 - 6.0 standard drinks    Types: 4 - 6 Glasses of wine per week    Comment: occasiona;  Marland Kitchen Drug use: No  . Sexual activity: Not on file    Comment: admin asst, walks on treadmill 4 X week, married.  Lifestyle  . Physical activity    Days per week: Not on file    Minutes per session: Not on file  . Stress: Not on file  Relationships  . Social Herbalist on phone: Not on file    Gets together: Not on file    Attends religious  service: Not on file    Active member of club or organization: Not on file    Attends meetings of clubs or organizations: Not on file    Relationship status: Not on file  . Intimate partner violence    Fear of current or ex partner: Not on file    Emotionally abused: Not on file    Physically abused: Not on file    Forced sexual activity: Not on file  Other Topics Concern  . Not on file  Social History Narrative  . Not on file    Outpatient Medications Prior to Visit  Medication Sig Dispense Refill  . albuterol (PROAIR HFA) 108 (90 Base) MCG/ACT inhaler INHALE 2 PUFFS INTO THE LUNGS EVERY 6 (SIX) HOURS AS NEEDED FOR WHEEZING OR SHORTNESS OF BREATH. 25.5 Inhaler 1  . Multiple Vitamin (MULTIVITAMIN) tablet Take by mouth. MultiVites-Take 2 daily    . hydrochlorothiazide (HYDRODIURIL) 25 MG tablet Take 1 tablet (25 mg total) by mouth daily. APPT FOR FURTHER REFILLS 90 tablet 0  . losartan (COZAAR) 100 MG tablet Take half tablet (50 mg) by mouth daily 45 tablet  1  . metoprolol succinate (TOPROL-XL) 50 MG 24 hr tablet Take 1 tablet (50 mg total) by mouth daily. NO REFILLS. Pt is due for f/u appt w/PCP. 15 tablet 0  . Esomeprazole Magnesium (NEXIUM PO) Take 1 tablet by mouth.     No facility-administered medications prior to visit.     Allergies  Allergen Reactions  . Augmentin [Amoxicillin-Pot Clavulanate]     diarrhea  . Lisinopril Cough    ROS Review of Systems    Objective:    Physical Exam  BP 132/72   Pulse (!) 50   Ht 5' 8" (1.727 m)   BMI 26.00 kg/m  Wt Readings from Last 3 Encounters:  07/17/17 171 lb (77.6 kg)  09/09/16 169 lb (76.7 kg)  02/25/16 170 lb (77.1 kg)     Health Maintenance Due  Topic Date Due  . DEXA SCAN  08/24/2016    There are no preventive care reminders to display for this patient.  Lab Results  Component Value Date   TSH 1.915 05/20/2013   Lab Results  Component Value Date   WBC 5.5 05/20/2013   HGB 14.5 05/20/2013   HCT 41.6 05/20/2013   MCV 83.4 05/20/2013   PLT 197 05/20/2013   Lab Results  Component Value Date   NA 139 07/17/2017   K 3.9 07/17/2017   CO2 30 07/17/2017   GLUCOSE 98 07/17/2017   BUN 21 07/17/2017   CREATININE 1.18 (H) 07/17/2017   BILITOT 0.5 05/02/2016   ALKPHOS 45 05/02/2016   AST 16 05/02/2016   ALT 14 05/02/2016   PROT 6.4 05/02/2016   ALBUMIN 4.1 05/02/2016   CALCIUM 9.7 07/17/2017   Lab Results  Component Value Date   CHOL 245 (H) 07/17/2017   Lab Results  Component Value Date   HDL 71 07/17/2017   Lab Results  Component Value Date   LDLCALC 148 (H) 07/17/2017   Lab Results  Component Value Date   TRIG 131 07/17/2017   Lab Results  Component Value Date   CHOLHDL 3.5 07/17/2017   No results found for: HGBA1C    Assessment & Plan:   Problem List Items Addressed This Visit      Cardiovascular and Mediastinum   ESSENTIAL HYPERTENSION, BENIGN    BP well controlled. Try to shoot for SBP less than 130. Due for labs.  F/u in 6 mo.  Combine losartan and HCTZ.       Relevant Medications   metoprolol succinate (TOPROL-XL) 50 MG 24 hr tablet   losartan-hydrochlorothiazide (HYZAAR) 100-25 MG tablet   Other Relevant Orders   CMP14+EGFR     Genitourinary   Chronic kidney disease (CKD) stage G3b/A1, moderately decreased glomerular filtration rate (GFR) between 30-44 mL/min/1.73 square meter and albuminuria creatinine ratio less than 30 mg/g (HCC) - Primary    Due for recheck renal function.        Relevant Orders   Lipid panel   Microalbumin, urine   CMP14+EGFR     Other   Chronic cough    Improved off lisinopril.  Losartan may be contributing vs reflux, etc. Will try PPI for 2 weeks. If not improving can consider change the losartan to calcium channel blocker.        Other Visit Diagnoses    Essential hypertension       Relevant Medications   metoprolol succinate (TOPROL-XL) 50 MG 24 hr tablet   losartan-hydrochlorothiazide (HYZAAR) 100-25 MG tablet   Other Relevant Orders   Lipid panel   Microalbumin, urine   CMP14+EGFR   Screening, lipid       Relevant Orders   Lipid panel   Microalbumin, urine      Meds ordered this encounter  Medications  . metoprolol succinate (TOPROL-XL) 50 MG 24 hr tablet    Sig: Take 1 tablet (50 mg total) by mouth daily.    Dispense:  90 tablet    Refill:  1  . DISCONTD: hydrochlorothiazide (HYDRODIURIL) 25 MG tablet    Sig: Take 1 tablet (25 mg total) by mouth daily.    Dispense:  90 tablet    Refill:  1  . DISCONTD: losartan (COZAAR) 100 MG tablet    Sig: Take half tablet (50 mg) by mouth daily    Dispense:  45 tablet    Refill:  1  . losartan-hydrochlorothiazide (HYZAAR) 100-25 MG tablet    Sig: Take 1 tablet by mouth daily.    Dispense:  90 tablet    Refill:  3    Follow-up: No follow-ups on file.      I discussed the assessment and treatment plan with the patient. The patient was provided an opportunity to ask questions and all were answered. The patient  agreed with the plan and demonstrated an understanding of the instructions.   The patient was advised to call back or seek an in-person evaluation if the symptoms worsen or if the condition fails to improve as anticipated.  Beatrice Lecher, MD

## 2018-09-10 NOTE — Assessment & Plan Note (Signed)
Improved off lisinopril.  Losartan may be contributing vs reflux, etc. Will try PPI for 2 weeks. If not improving can consider change the losartan to calcium channel blocker.

## 2018-09-18 LAB — LIPID PANEL
Chol/HDL Ratio: 3.3 ratio (ref 0.0–4.4)
Cholesterol, Total: 214 mg/dL — ABNORMAL HIGH (ref 100–199)
HDL: 65 mg/dL (ref >39)
LDL Calculated: 119 mg/dL — ABNORMAL HIGH (ref 0–99)
Triglycerides: 152 mg/dL — ABNORMAL HIGH (ref 0–149)
VLDL Cholesterol Cal: 30 mg/dL (ref 5–40)

## 2018-09-18 LAB — CMP14+EGFR
ALT: 11 IU/L (ref 0–32)
AST: 19 IU/L (ref 0–40)
Albumin/Globulin Ratio: 2.3 — ABNORMAL HIGH (ref 1.2–2.2)
Albumin: 4.6 g/dL (ref 3.8–4.8)
Alkaline Phosphatase: 48 IU/L (ref 39–117)
BUN/Creatinine Ratio: 19 (ref 12–28)
BUN: 24 mg/dL (ref 8–27)
Bilirubin Total: 0.6 mg/dL (ref 0.0–1.2)
CO2: 24 mmol/L (ref 20–29)
Calcium: 9.7 mg/dL (ref 8.7–10.3)
Chloride: 98 mmol/L (ref 96–106)
Creatinine, Ser: 1.28 mg/dL — ABNORMAL HIGH (ref 0.57–1.00)
GFR calc Af Amer: 50 mL/min/{1.73_m2} — ABNORMAL LOW (ref >59)
GFR calc non Af Amer: 43 mL/min/{1.73_m2} — ABNORMAL LOW (ref >59)
Globulin, Total: 2 g/dL (ref 1.5–4.5)
Glucose: 98 mg/dL (ref 65–99)
Potassium: 4 mmol/L (ref 3.5–5.2)
Sodium: 139 mmol/L (ref 134–144)
Total Protein: 6.6 g/dL (ref 6.0–8.5)

## 2018-09-18 LAB — MICROALBUMIN, URINE: Microalbumin, Urine: 15.4 ug/mL

## 2019-03-07 ENCOUNTER — Other Ambulatory Visit: Payer: Self-pay | Admitting: Family Medicine

## 2019-03-07 DIAGNOSIS — I10 Essential (primary) hypertension: Secondary | ICD-10-CM

## 2019-03-08 ENCOUNTER — Ambulatory Visit: Payer: BC Managed Care – PPO | Attending: Internal Medicine

## 2019-03-08 ENCOUNTER — Other Ambulatory Visit: Payer: Self-pay

## 2019-03-08 DIAGNOSIS — Z23 Encounter for immunization: Secondary | ICD-10-CM | POA: Insufficient documentation

## 2019-03-08 NOTE — Progress Notes (Signed)
   Covid-19 Vaccination Clinic  Name:  Stephanie Cross    MRN: [id number] DOB: 17-Jul-1950  03/08/2019  Stephanie Cross was observed post Covid-19 immunization for 15 minutes without incidence. She was provided with Vaccine Information Sheet and instruction to access the V-Safe system.   Stephanie Cross was instructed to call 911 with any severe reactions post vaccine: Marland Kitchen Difficulty breathing  . Swelling of your face and throat  . A fast heartbeat  . A bad rash all over your body  . Dizziness and weakness    Immunizations Administered    Name Date Dose VIS Date Route   Pfizer COVID-19 Vaccine 03/08/2019  5:05 PM 0.3 mL 01/04/2019 Intramuscular   Manufacturer: [employer], [employer]   Lot: [id number]   NDC: [address]-6

## 2019-03-09 ENCOUNTER — Ambulatory Visit: Payer: BC Managed Care – PPO

## 2019-03-20 ENCOUNTER — Encounter: Payer: Self-pay | Admitting: Family Medicine

## 2019-03-20 DIAGNOSIS — M25579 Pain in unspecified ankle and joints of unspecified foot: Secondary | ICD-10-CM

## 2019-03-27 NOTE — Telephone Encounter (Signed)
Orders pended. Please fax to lab in her note

## 2019-03-31 ENCOUNTER — Ambulatory Visit: Payer: BC Managed Care – PPO | Attending: Internal Medicine

## 2019-03-31 DIAGNOSIS — Z23 Encounter for immunization: Secondary | ICD-10-CM | POA: Insufficient documentation

## 2019-03-31 NOTE — Progress Notes (Signed)
   Covid-19 Vaccination Clinic  Name:  Stephanie Cross    MRN: [id number] DOB: 1950-02-15  03/31/2019  [name] was observed post Covid-19 immunization for 15 minutes without incident. She was provided with Vaccine Information Sheet and instruction to access the V-Safe system.   [name] was instructed to call 911 with any severe reactions post vaccine: Marland Kitchen Difficulty breathing  . Swelling of face and throat  . A fast heartbeat  . A bad rash all over body  . Dizziness and weakness   Immunizations Administered    Name Date Dose VIS Date Route   Pfizer COVID-19 Vaccine 03/31/2019 11:13 AM 0.3 mL 01/04/2019 Intramuscular   Manufacturer: [employer], [employer]   Lot: [id number]   NDC: [address]-2

## 2019-04-06 LAB — URIC ACID: Uric Acid: 7.2 mg/dL (ref 3.0–7.2)

## 2019-04-06 LAB — CBC
Hematocrit: 38.2 % (ref 34.0–46.6)
Hemoglobin: 12.9 g/dL (ref 11.1–15.9)
MCH: 30.1 pg (ref 26.6–33.0)
MCHC: 33.8 g/dL (ref 31.5–35.7)
MCV: 89 fL (ref 79–97)
Platelets: 186 10*3/uL (ref 150–450)
RBC: 4.28 x10E6/uL (ref 3.77–5.28)
RDW: 12.7 % (ref 11.7–15.4)
WBC: 4.9 10*3/uL (ref 3.4–10.8)

## 2019-04-06 LAB — SEDIMENTATION RATE: Sed Rate: 2 mm/hr (ref 0–40)

## 2019-04-08 ENCOUNTER — Encounter: Payer: Self-pay | Admitting: Family Medicine

## 2019-04-08 DIAGNOSIS — M25579 Pain in unspecified ankle and joints of unspecified foot: Secondary | ICD-10-CM

## 2019-04-09 NOTE — Telephone Encounter (Signed)
Referral placed. See if Arline Asp can see if Dr. Ardelle Anton is available in [place]

## 2019-04-23 ENCOUNTER — Ambulatory Visit (INDEPENDENT_AMBULATORY_CARE_PROVIDER_SITE_OTHER): Payer: BC Managed Care – PPO | Admitting: Sports Medicine

## 2019-04-23 ENCOUNTER — Encounter: Payer: Self-pay | Admitting: Sports Medicine

## 2019-04-23 ENCOUNTER — Other Ambulatory Visit: Payer: Self-pay | Admitting: Sports Medicine

## 2019-04-23 ENCOUNTER — Other Ambulatory Visit: Payer: Self-pay

## 2019-04-23 ENCOUNTER — Ambulatory Visit (INDEPENDENT_AMBULATORY_CARE_PROVIDER_SITE_OTHER): Payer: BC Managed Care – PPO

## 2019-04-23 VITALS — BP 140/71 | HR 59 | Temp 97.3°F

## 2019-04-23 DIAGNOSIS — M109 Gout, unspecified: Secondary | ICD-10-CM

## 2019-04-23 DIAGNOSIS — M779 Enthesopathy, unspecified: Secondary | ICD-10-CM | POA: Diagnosis not present

## 2019-04-23 DIAGNOSIS — M10072 Idiopathic gout, left ankle and foot: Secondary | ICD-10-CM

## 2019-04-23 DIAGNOSIS — T560X1A Toxic effect of lead and its compounds, accidental (unintentional), initial encounter: Secondary | ICD-10-CM

## 2019-04-23 DIAGNOSIS — M2012 Hallux valgus (acquired), left foot: Secondary | ICD-10-CM

## 2019-04-23 DIAGNOSIS — M10172 Lead-induced gout, left ankle and foot: Secondary | ICD-10-CM

## 2019-04-23 DIAGNOSIS — M21619 Bunion of unspecified foot: Secondary | ICD-10-CM | POA: Diagnosis not present

## 2019-04-23 DIAGNOSIS — L603 Nail dystrophy: Secondary | ICD-10-CM

## 2019-04-23 DIAGNOSIS — M79672 Pain in left foot: Secondary | ICD-10-CM

## 2019-04-23 MED ORDER — COLCHICINE 0.6 MG PO TABS: 0.6000 mg | ORAL_TABLET | Freq: Every day | ORAL | 0 refills | Status: DC

## 2019-04-23 NOTE — Patient Instructions (Signed)

## 2019-04-23 NOTE — Telephone Encounter (Signed)
Patient is scheduled with [facility] in [place] - [facility]

## 2019-04-23 NOTE — Progress Notes (Signed)
Subjective: Stephanie Cross is a 69 y.o. female patient who presents to office for evaluation of Left foot pain. Patient complains of progressive pain especially over the last 8 months, Admits to pain that wakes her up at night at the 1st toe wit redness, and swelling to the area that is unrelieved. Patient also admits to also some significant thickening of the left first toenail reports that a previous podiatry did a nail procedure to the area and ever since the nail did not grow back properly reports initially that the toenail does not hurt but by the end of today's encounter reports that there is pain at the toenail and is concerned that there could be the possibility of this nail causing pain in her first toe.  Patient also had blood work completed by her PCP which revealed an elevation in the uric acid high normal range and was encouraged to decrease or change her diet to prevent issues with joint pain.  Patient immediately states that she drinks wine and sometimes when she does have certain foods in her diet that she likes to eat does feel like there is increased swelling and pain at the toe joint of the first toe on the left foot.  Review of Systems  All other systems reviewed and are negative.   Patient Active Problem List   Diagnosis Date Noted  . Chronic cough 09/10/2018  . 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 Gastroesophageal reflux disease with esophagitis 07/17/2017  . Chronic kidney disease (CKD) stage G3b/A1, moderately decreased glomerular filtration rate (GFR) between 30-44 mL/min/1.73 square meter and albuminuria creatinine ratio less than 30 mg/g 06/25/2014  . Nonspecific abnormal electrocardiogram (ECG) (EKG) 05/20/2013  . Asthma 01/24/2012  . POSTMENOPAUSAL STATUS 08/10/2009  . ESSENTIAL HYPERTENSION, BENIGN 03/16/2009  . MYALGIA 03/16/2009  . FATIGUE 03/16/2009  . MUSCLE STRAIN, HAMSTRING MUSCLE 03/06/2009    Current Outpatient Medications on  File Prior to Visit  Medication Sig Dispense Refill  . Acetaminophen (TYLENOL PO) Take by mouth as needed.    Marland Kitchen albuterol (PROAIR HFA) 108 (90 Base) MCG/ACT inhaler INHALE 2 PUFFS INTO THE LUNGS EVERY 6 (SIX) HOURS AS NEEDED FOR WHEEZING OR SHORTNESS OF BREATH. 25.5 Inhaler 1  . loratadine (CLARITIN) 10 MG tablet Take 10 mg by mouth daily as needed for allergies.    Marland Kitchen losartan-hydrochlorothiazide (HYZAAR) 100-25 MG tablet Take 1 tablet by mouth daily. 90 tablet 3  . metoprolol succinate (TOPROL-XL) 50 MG 24 hr tablet TAKE 1 TABLET BY MOUTH EVERY DAY 90 tablet 1  . Multiple Vitamin (MULTIVITAMIN) tablet Take by mouth. MultiVites-Take 2 daily     No current facility-administered medications on file prior to visit.    Allergies  Allergen Reactions  . Augmentin [Amoxicillin-Pot Clavulanate]     diarrhea  . Lisinopril Cough    Objective:  General: Alert and oriented x3 in no acute distress  Dermatology: Focal Swelling, minimal warmth, redness present on the left foot at hallux IPJ and MPJ, No open lesions bilateral lower extremities, no webspace macerations, no ecchymosis bilateral, all nails x 10 are well manicured except at the left great toenail where there is multiple trauma lines and severe thickening and distal lifting of the toenail.  Vascular: Dorsalis Pedis and Posterior Tibial pedal pulses 1/4, Capillary Fill Time 3 seconds,(+) pedal hair growth bilateral,Temperature gradient mildly increased over the left first toe with blanchable erythema/redness.  Neurology: Gross sensation intact via light touch bilateral, subjective throbbing pain to the first  toe that patient has difficulty describing symptoms to this area reports that it just hurts and feels like something does not feel right to the toe.  Musculoskeletal: There is tenderness to palpation to left first toe with significant hallux interphalangeus and bunion deformity on the left.  Gait: Unassisted  Xrays  Left Foot     Impression: Mild focal soft tissue swelling at first metatarsophalangeal joint and interphalangeal joint of the first toe on the left, there is mild contracture of the first toe with hallux interphalangeus deformity as well as bunion deformity with mild erosions likely consistent with history of elevated uric acid with superimposed bunion.       Assessment and Plan: Problem List Items Addressed This Visit    None    Visit Diagnoses    Acute idiopathic gout involving toe of left foot    -  Primary   Relevant Medications   Acetaminophen (TYLENOL PO)   colchicine 0.6 MG tablet   Bunion       Hallux interphalangeus, acquired, left       Capsulitis       Left foot pain       Nail dystrophy           -Complete examination performed -Xrays reviewed -Discussed treatement options for nail dystrophy first toe pain and gouty arthritis and gout education provided. -Rx Colchicine 0.6mg  to take for acute episodes of pain that wakes her up at night -Reviewed with patient blood work as provided and performed by PCP 2 weeks ago with uric acid noted to be elevated in the high normal range -Advised patient to practice a low purine diet -At no additional charge mechanically debrided left first toenail and advised patient to wear a protective toe cushion when in shoe to prevent rubbing -Advised patient if her toe continues to be painful even after me trimming the toenail and trying to treat for any underlying gout may benefit from a more in-depth arthritic panel with repeat uric acid levels as well as a further work-up or neuritis at the toe which could be related to irritation to the medial dorsal cutaneous nerve at areas that are inflamed over the bunion and toe deformity -Patient to return in 3 weeks for re-check/further discussion for long term management of gout or sooner if condition worsens.  Asencion Islam, DPM

## 2019-05-14 ENCOUNTER — Ambulatory Visit: Payer: Medicare Other | Admitting: Sports Medicine

## 2019-09-01 ENCOUNTER — Other Ambulatory Visit: Payer: Self-pay | Admitting: Family Medicine

## 2019-09-01 DIAGNOSIS — I10 Essential (primary) hypertension: Secondary | ICD-10-CM

## 2019-09-02 ENCOUNTER — Telehealth: Payer: Self-pay | Admitting: Family Medicine

## 2019-09-02 NOTE — Telephone Encounter (Signed)
Left voicemail for patient for the following:  hey can you call her for f/u for bp and fasting labs. she hasn't been seen in 1 yr. Thanks.  In case she calls back.

## 2019-09-24 ENCOUNTER — Other Ambulatory Visit: Payer: Self-pay | Admitting: Family Medicine

## 2019-09-24 DIAGNOSIS — I10 Essential (primary) hypertension: Secondary | ICD-10-CM

## 2019-10-20 ENCOUNTER — Other Ambulatory Visit: Payer: Self-pay | Admitting: Family Medicine

## 2019-10-20 DIAGNOSIS — I10 Essential (primary) hypertension: Secondary | ICD-10-CM

## 2019-11-13 ENCOUNTER — Other Ambulatory Visit: Payer: Self-pay | Admitting: Family Medicine

## 2019-11-13 DIAGNOSIS — I10 Essential (primary) hypertension: Secondary | ICD-10-CM

## 2019-11-22 ENCOUNTER — Other Ambulatory Visit: Payer: Self-pay | Admitting: Family Medicine

## 2019-11-22 DIAGNOSIS — I10 Essential (primary) hypertension: Secondary | ICD-10-CM

## 2019-11-26 ENCOUNTER — Other Ambulatory Visit: Payer: Self-pay

## 2019-11-26 DIAGNOSIS — N1832 Chronic kidney disease, stage 3b: Secondary | ICD-10-CM

## 2019-11-26 DIAGNOSIS — E790 Hyperuricemia without signs of inflammatory arthritis and tophaceous disease: Secondary | ICD-10-CM

## 2019-11-26 DIAGNOSIS — E7800 Pure hypercholesterolemia, unspecified: Secondary | ICD-10-CM

## 2019-11-26 DIAGNOSIS — Z0000 Encounter for general adult medical examination without abnormal findings: Secondary | ICD-10-CM

## 2019-11-26 DIAGNOSIS — I10 Essential (primary) hypertension: Secondary | ICD-10-CM

## 2019-11-26 NOTE — Progress Notes (Signed)
Ordered labs

## 2019-12-09 ENCOUNTER — Encounter: Payer: Self-pay | Admitting: Family Medicine

## 2019-12-09 ENCOUNTER — Telehealth (INDEPENDENT_AMBULATORY_CARE_PROVIDER_SITE_OTHER): Payer: BC Managed Care – PPO | Admitting: Family Medicine

## 2019-12-09 VITALS — BP 141/69 | HR 63 | Ht 68.0 in | Wt 165.8 lb

## 2019-12-09 DIAGNOSIS — N1832 Chronic kidney disease, stage 3b: Secondary | ICD-10-CM | POA: Diagnosis not present

## 2019-12-09 DIAGNOSIS — I10 Essential (primary) hypertension: Secondary | ICD-10-CM | POA: Diagnosis not present

## 2019-12-09 DIAGNOSIS — Z1231 Encounter for screening mammogram for malignant neoplasm of breast: Secondary | ICD-10-CM | POA: Diagnosis not present

## 2019-12-09 DIAGNOSIS — Z780 Asymptomatic menopausal state: Secondary | ICD-10-CM

## 2019-12-09 MED ORDER — LOSARTAN POTASSIUM-HCTZ 100-25 MG PO TABS
1.0000 | ORAL_TABLET | Freq: Every day | ORAL | 1 refills | Status: DC
Start: 2019-12-09 — End: 2020-06-04

## 2019-12-09 MED ORDER — METOPROLOL SUCCINATE ER 50 MG PO TB24: 50.0000 mg | ORAL_TABLET | Freq: Every day | ORAL | 1 refills | Status: DC

## 2019-12-09 NOTE — Assessment & Plan Note (Signed)
Blood pressure mildly elevated today.  Again she was hopeful with recent weight loss and exercise and dietary changes that it would actually be better she is taking her blood pressure medication regularly.  Again we will get updated labs.  She agreed to check her blood pressure about twice a week for the next month and then send those to me over my chart so that we can take a look and see if most of them are well controlled or if she is having a significant number that are elevated.

## 2019-12-09 NOTE — Progress Notes (Signed)
Virtual Visit via Video Note  I connected with Lynnell Jude on 12/09/19 at 11:10 AM EST by a video enabled telemedicine application and verified that I am speaking with the correct person using two identifiers.   I discussed the limitations of evaluation and management by telemedicine and the availability of in person appointments. The patient expressed understanding and agreed to proceed.  Patient location: AT HOME  Provider location: in office  Subjective:    CC: f/u bp  HPI: Hypertension- Pt denies chest pain, SOB, dizziness, or heart palpitations.  Taking meds as directed w/o problems.  Denies medication side effects.  She says at home she really has not checked her blood pressure since August but in August it looked great.  But she is had a lot goingo n, there are some contractors in her home.  Says she is been trying to exercise 5 days a week doing the treadmill going to the gym.  She is really increased her veggie intake and so has been able to lose some weight.  She is been feeling better physically and was hoping that her blood pressure would look a little better as well she denies any recent headaches.  F/u CKD 3 - due to recheck labs.   Did have some questions about getting her Covid booster and wanted to know if she should go ahead and get that done or not it has been over 6 months since her last series of vaccinations.  Past medical history, Surgical history, Family history not pertinant except as noted below, Social history, Allergies, and medications have been entered into the medical record, reviewed, and corrections made.   Review of Systems: No fevers, chills, night sweats, weight loss, chest pain, or shortness of breath.   Objective:    General: Speaking clearly in complete sentences without any shortness of breath.  Alert and oriented x3.  Normal judgment. No apparent acute distress.    Impression and Recommendations:    Chronic kidney disease (CKD) stage  G3b/A1, moderately decreased glomerular filtration rate (GFR) between 30-44 mL/min/1.73 square meter and albuminuria creatinine ratio less than 30 mg/g Has been over a year since we have done blood work so we definitely need to recheck this to make sure that kidney function is stable.  ESSENTIAL HYPERTENSION, BENIGN Blood pressure mildly elevated today.  Again she was hopeful with recent weight loss and exercise and dietary changes that it would actually be better she is taking her blood pressure medication regularly.  Again we will get updated labs.  She agreed to check her blood pressure about twice a week for the next month and then send those to me over my chart so that we can take a look and see if most of them are well controlled or if she is having a significant number that are elevated.   Did encourage her to go ahead and get her Covid booster.  Also encouraged her to get vaccinated against shingles.  We are happy to do this here in our office with a nurse visit or she can have it done at the pharmacy.  Encouraged her to schedule her mammogram as well.    Time spent in encounter 21 minutes  I discussed the assessment and treatment plan with the patient. The patient was provided an opportunity to ask questions and all were answered. The patient agreed with the plan and demonstrated an understanding of the instructions.   The patient was advised to call back or seek an in-person  evaluation if the symptoms worsen or if the condition fails to improve as anticipated.   Nani Gasser, MD

## 2019-12-09 NOTE — Assessment & Plan Note (Signed)
Has been over a year since we have done blood work so we definitely need to recheck this to make sure that kidney function is stable.

## 2019-12-12 LAB — CBC WITH DIFFERENTIAL/PLATELET
Basophils Absolute: 0 10*3/uL (ref 0.0–0.2)
Basos: 0 %
EOS (ABSOLUTE): 0.2 10*3/uL (ref 0.0–0.4)
Eos: 4 %
Hematocrit: 39.9 % (ref 34.0–46.6)
Hemoglobin: 13.4 g/dL (ref 11.1–15.9)
Immature Grans (Abs): 0 10*3/uL (ref 0.0–0.1)
Immature Granulocytes: 0 %
Lymphocytes Absolute: 1.3 10*3/uL (ref 0.7–3.1)
Lymphs: 25 %
MCH: 29.9 pg (ref 26.6–33.0)
MCHC: 33.6 g/dL (ref 31.5–35.7)
MCV: 89 fL (ref 79–97)
Monocytes Absolute: 0.5 10*3/uL (ref 0.1–0.9)
Monocytes: 10 %
Neutrophils Absolute: 3.1 10*3/uL (ref 1.4–7.0)
Neutrophils: 61 %
Platelets: 205 10*3/uL (ref 150–450)
RBC: 4.48 x10E6/uL (ref 3.77–5.28)
RDW: 12.3 % (ref 11.7–15.4)
WBC: 5.1 10*3/uL (ref 3.4–10.8)

## 2019-12-12 LAB — CMP14+EGFR
ALT: 14 IU/L (ref 0–32)
AST: 16 IU/L (ref 0–40)
Albumin/Globulin Ratio: 2 (ref 1.2–2.2)
Albumin: 4.8 g/dL (ref 3.8–4.8)
Alkaline Phosphatase: 58 IU/L (ref 44–121)
BUN/Creatinine Ratio: 22 (ref 12–28)
BUN: 29 mg/dL — ABNORMAL HIGH (ref 8–27)
Bilirubin Total: 0.5 mg/dL (ref 0.0–1.2)
CO2: 24 mmol/L (ref 20–29)
Calcium: 10.1 mg/dL (ref 8.7–10.3)
Chloride: 98 mmol/L (ref 96–106)
Creatinine, Ser: 1.32 mg/dL — ABNORMAL HIGH (ref 0.57–1.00)
GFR calc Af Amer: 48 mL/min/{1.73_m2} — ABNORMAL LOW (ref >59)
GFR calc non Af Amer: 41 mL/min/{1.73_m2} — ABNORMAL LOW (ref >59)
Globulin, Total: 2.4 g/dL (ref 1.5–4.5)
Glucose: 97 mg/dL (ref 65–99)
Potassium: 4 mmol/L (ref 3.5–5.2)
Sodium: 137 mmol/L (ref 134–144)
Total Protein: 7.2 g/dL (ref 6.0–8.5)

## 2019-12-12 LAB — MICROALBUMIN / CREATININE URINE RATIO
Creatinine, Urine: 183.7 mg/dL
Microalb/Creat Ratio: 5 mg/g creat (ref 0–29)
Microalbumin, Urine: 10.1 ug/mL

## 2019-12-12 LAB — LIPID PANEL
Chol/HDL Ratio: 3.1 ratio (ref 0.0–4.4)
Cholesterol, Total: 228 mg/dL — ABNORMAL HIGH (ref 100–199)
HDL: 73 mg/dL (ref >39)
LDL Chol Calc (NIH): 134 mg/dL — ABNORMAL HIGH (ref 0–99)
Triglycerides: 123 mg/dL (ref 0–149)
VLDL Cholesterol Cal: 21 mg/dL (ref 5–40)

## 2019-12-12 LAB — URIC ACID: Uric Acid: 6.7 mg/dL (ref 3.0–7.2)

## 2020-01-10 ENCOUNTER — Ambulatory Visit: Payer: BC Managed Care – PPO

## 2020-03-12 ENCOUNTER — Other Ambulatory Visit: Payer: Self-pay | Admitting: Neurology

## 2020-03-12 MED ORDER — ALBUTEROL SULFATE HFA 108 (90 BASE) MCG/ACT IN AERS: INHALATION_SPRAY | RESPIRATORY_TRACT | 1 refills | Status: DC

## 2020-03-12 NOTE — Progress Notes (Signed)
Patient uses albuterol during exercise, had gotten 3 at a time in the past and had a lot of extras so has not filled in awhile. She is in need of refills. Sent to pharmacy.

## 2020-04-03 ENCOUNTER — Other Ambulatory Visit: Payer: Self-pay | Admitting: Family Medicine

## 2020-06-04 ENCOUNTER — Other Ambulatory Visit: Payer: Self-pay | Admitting: Family Medicine

## 2020-06-04 DIAGNOSIS — I10 Essential (primary) hypertension: Secondary | ICD-10-CM

## 2020-08-27 ENCOUNTER — Other Ambulatory Visit: Payer: Self-pay | Admitting: Family Medicine

## 2020-08-27 DIAGNOSIS — I10 Essential (primary) hypertension: Secondary | ICD-10-CM

## 2020-09-01 NOTE — Telephone Encounter (Signed)
Please call pt and have her schedule a f/u for bp and labs for her medication refills thanks. Sending a 30 day supply for now

## 2020-09-02 NOTE — Telephone Encounter (Signed)
Pt informed of recommendations and 30 day refill. 

## 2020-09-23 ENCOUNTER — Other Ambulatory Visit: Payer: Self-pay | Admitting: Family Medicine

## 2020-09-23 DIAGNOSIS — I10 Essential (primary) hypertension: Secondary | ICD-10-CM

## 2020-09-23 NOTE — Telephone Encounter (Signed)
Please call pt for f/u on bp . Will send a 30 day supply. Appointment required for refills.thanks

## 2020-09-23 NOTE — Telephone Encounter (Signed)
Called pt twice no answer on first call, second call did get a voice mail, left voice mail letting her know that we needed her to call and schedule an appt for a f/u on her BP. - tvt

## 2020-09-30 NOTE — Telephone Encounter (Signed)
Left another voicemail letting patient know we were reaching out again to schedule a f/u appt with PCP for further refills on meds. Am

## 2020-10-01 ENCOUNTER — Other Ambulatory Visit: Payer: Self-pay | Admitting: *Deleted

## 2020-10-01 DIAGNOSIS — E7800 Pure hypercholesterolemia, unspecified: Secondary | ICD-10-CM

## 2020-10-01 DIAGNOSIS — E790 Hyperuricemia without signs of inflammatory arthritis and tophaceous disease: Secondary | ICD-10-CM

## 2020-10-01 DIAGNOSIS — I10 Essential (primary) hypertension: Secondary | ICD-10-CM

## 2020-10-01 DIAGNOSIS — N1832 Chronic kidney disease, stage 3b: Secondary | ICD-10-CM

## 2020-10-11 ENCOUNTER — Other Ambulatory Visit: Payer: Self-pay | Admitting: Family Medicine

## 2020-10-11 DIAGNOSIS — I10 Essential (primary) hypertension: Secondary | ICD-10-CM

## 2020-10-19 ENCOUNTER — Telehealth (INDEPENDENT_AMBULATORY_CARE_PROVIDER_SITE_OTHER): Payer: Self-pay | Admitting: Family Medicine

## 2020-10-19 ENCOUNTER — Encounter: Payer: Self-pay | Admitting: Family Medicine

## 2020-10-19 VITALS — BP 124/72 | HR 65 | Ht 68.0 in | Wt 156.4 lb

## 2020-10-19 DIAGNOSIS — I10 Essential (primary) hypertension: Secondary | ICD-10-CM

## 2020-10-19 DIAGNOSIS — N1832 Chronic kidney disease, stage 3b: Secondary | ICD-10-CM

## 2020-10-19 DIAGNOSIS — R058 Other specified cough: Secondary | ICD-10-CM

## 2020-10-19 MED ORDER — METOPROLOL SUCCINATE ER 25 MG PO TB24: 25.0000 mg | ORAL_TABLET | Freq: Every day | ORAL | 0 refills | Status: DC

## 2020-10-19 NOTE — Progress Notes (Signed)
Pt wanted to know at what point she can stop taking her BP medication.

## 2020-10-19 NOTE — Progress Notes (Signed)
Virtual Visit via Video Note  I connected with Stephanie Cross on 10/19/20 at  7:50 AM EDT by a video enabled telemedicine application and verified that I am speaking with the correct person using two identifiers.   I discussed the limitations of evaluation and management by telemedicine and the availability of in person appointments. The patient expressed understanding and agreed to proceed.  Patient location: at home Provider location: in office  Subjective:    CC: BP check  HPI:  Hypertension- Pt denies chest pain, SOB, dizziness, or heart palpitations.  Taking meds as directed w/o problems.  Denies medication side effects.  Has been going to [employer] and has lost weight.  2.5 miles 4 times per week.  Limited food after 8. BP has been getting lower.   F/u CKD 3  - no recent changes.    Went to UC about on 9/18 ago for bronchitis and asthma, after 6 days of sxs. She did have a low grade temp with it.  Just finished doxycycline.   Past medical history, Surgical history, Family history not pertinant except as noted below, Social history, Allergies, and medications have been entered into the medical record, reviewed, and corrections made.    Objective:    General: Speaking clearly in complete sentences without any shortness of breath.  Alert and oriented x3.  Normal judgment. No apparent acute distress.    Impression and Recommendations:    ESSENTIAL HYPERTENSION, BENIGN BP looks well controlled.  Will dec metoprolol to 25mg  QD. Answered questions about BP machine.  F/u in 4-6 months.    Chronic kidney disease (CKD) stage G3b/A1, moderately decreased glomerular filtration rate (GFR) between 30-44 mL/min/1.73 square meter and albuminuria creatinine ratio less than 30 mg/g Due to recheck renal function.   Post viral cough - gave reassurance. She is getting better.    No orders of the defined types were placed in this encounter.   Meds ordered this encounter  Medications    metoprolol succinate (TOPROL-XL) 25 MG 24 hr tablet    Sig: Take 1 tablet (25 mg total) by mouth daily.    Dispense:  90 tablet    Refill:  0    I discussed the assessment and treatment plan with the patient. The patient was provided an opportunity to ask questions and all were answered. The patient agreed with the plan and demonstrated an understanding of the instructions.  Discussed need for shingles vaccine.     The patient was advised to call back or seek an in-person evaluation if the symptoms worsen or if the condition fails to improve as anticipated.   , MD

## 2020-10-19 NOTE — Assessment & Plan Note (Addendum)
BP looks well controlled.  Will dec metoprolol to 25mg  QD. Answered questions about BP machine.  F/u in 4-6 months.

## 2020-10-19 NOTE — Assessment & Plan Note (Signed)
Due to recheck renal function. 

## 2020-10-21 LAB — CMP14+EGFR
ALT: 11 IU/L (ref 0–32)
AST: 14 IU/L (ref 0–40)
Albumin/Globulin Ratio: 1.6 (ref 1.2–2.2)
Albumin: 4.6 g/dL (ref 3.8–4.8)
Alkaline Phosphatase: 59 IU/L (ref 44–121)
BUN/Creatinine Ratio: 22 (ref 12–28)
BUN: 27 mg/dL (ref 8–27)
Bilirubin Total: 0.8 mg/dL (ref 0.0–1.2)
CO2: 22 mmol/L (ref 20–29)
Calcium: 10.2 mg/dL (ref 8.7–10.3)
Chloride: 99 mmol/L (ref 96–106)
Creatinine, Ser: 1.23 mg/dL — ABNORMAL HIGH (ref 0.57–1.00)
Globulin, Total: 2.9 g/dL (ref 1.5–4.5)
Glucose: 94 mg/dL (ref 70–99)
Potassium: 4.1 mmol/L (ref 3.5–5.2)
Sodium: 139 mmol/L (ref 134–144)
Total Protein: 7.5 g/dL (ref 6.0–8.5)
eGFR: 48 mL/min/{1.73_m2} — ABNORMAL LOW (ref >59)

## 2020-10-21 LAB — CBC
Hematocrit: 41.7 % (ref 34.0–46.6)
Hemoglobin: 13.7 g/dL (ref 11.1–15.9)
MCH: 29.3 pg (ref 26.6–33.0)
MCHC: 32.9 g/dL (ref 31.5–35.7)
MCV: 89 fL (ref 79–97)
Platelets: 219 10*3/uL (ref 150–450)
RBC: 4.68 x10E6/uL (ref 3.77–5.28)
RDW: 12.9 % (ref 11.7–15.4)
WBC: 6 10*3/uL (ref 3.4–10.8)

## 2020-10-21 LAB — LIPID PANEL
Chol/HDL Ratio: 3.2 ratio (ref 0.0–4.4)
Cholesterol, Total: 227 mg/dL — ABNORMAL HIGH (ref 100–199)
HDL: 71 mg/dL (ref >39)
LDL Chol Calc (NIH): 128 mg/dL — ABNORMAL HIGH (ref 0–99)
Triglycerides: 160 mg/dL — ABNORMAL HIGH (ref 0–149)
VLDL Cholesterol Cal: 28 mg/dL (ref 5–40)

## 2020-10-21 LAB — URIC ACID: Uric Acid: 6.5 mg/dL (ref 3.0–7.2)

## 2020-10-23 NOTE — Progress Notes (Signed)
Hi Stephanie Cross, kidney function is stable its right at 1.2 which is about where its been running for the last 4+ years.  Liver function is normal.  LDL cholesterol is still mildly elevated.  Based on your current risk calculation over the next 10 years your risk for having cardiovascular disease is about 10.5%.  Based on that risk score I would highly recommend taking a statin to lower your cholesterol and reduce your cardiovascular risk score.  The risk includes heart attack and stroke.  If you are okay with starting a statin it please let me know and I can send over prescription and then we can recheck labs in 3 months.  Blood count and uric acid level look okay.  The 10-year ASCVD risk score (Arnett DK, et al., 2019) is: 10.5%   Values used to calculate the score:     Age: 70 years     Sex: Female     Is Non-Hispanic African American: No     Diabetic: No     Tobacco smoker: No     Systolic Blood Pressure: 124 mmHg     Is BP treated: Yes     HDL Cholesterol: 71 mg/dL     Total Cholesterol: 227 mg/dL

## 2020-10-26 ENCOUNTER — Encounter: Payer: Self-pay | Admitting: Family Medicine

## 2020-10-27 NOTE — Telephone Encounter (Signed)
K, plan to recheck 6 months.

## 2021-01-12 ENCOUNTER — Other Ambulatory Visit: Payer: Self-pay | Admitting: Family Medicine

## 2021-01-12 DIAGNOSIS — I10 Essential (primary) hypertension: Secondary | ICD-10-CM

## 2021-04-09 ENCOUNTER — Other Ambulatory Visit: Payer: Self-pay | Admitting: Family Medicine

## 2021-04-09 DIAGNOSIS — I10 Essential (primary) hypertension: Secondary | ICD-10-CM

## 2021-05-02 ENCOUNTER — Other Ambulatory Visit: Payer: Self-pay | Admitting: Family Medicine

## 2021-05-02 DIAGNOSIS — I10 Essential (primary) hypertension: Secondary | ICD-10-CM

## 2021-05-06 ENCOUNTER — Other Ambulatory Visit: Payer: Self-pay | Admitting: *Deleted

## 2021-05-06 DIAGNOSIS — J302 Other seasonal allergic rhinitis: Secondary | ICD-10-CM

## 2021-05-06 DIAGNOSIS — I10 Essential (primary) hypertension: Secondary | ICD-10-CM

## 2021-05-10 ENCOUNTER — Other Ambulatory Visit: Payer: Self-pay | Admitting: Family Medicine

## 2021-05-10 DIAGNOSIS — I10 Essential (primary) hypertension: Secondary | ICD-10-CM

## 2021-05-28 ENCOUNTER — Encounter: Payer: Self-pay | Admitting: Family Medicine

## 2021-05-28 ENCOUNTER — Telehealth (INDEPENDENT_AMBULATORY_CARE_PROVIDER_SITE_OTHER): Payer: 59 | Admitting: Family Medicine

## 2021-05-28 VITALS — BP 118/71 | HR 57 | Ht 68.0 in | Wt 157.0 lb

## 2021-05-28 DIAGNOSIS — J302 Other seasonal allergic rhinitis: Secondary | ICD-10-CM

## 2021-05-28 DIAGNOSIS — N1832 Chronic kidney disease, stage 3b: Secondary | ICD-10-CM

## 2021-05-28 DIAGNOSIS — I10 Essential (primary) hypertension: Secondary | ICD-10-CM

## 2021-05-28 DIAGNOSIS — R0981 Nasal congestion: Secondary | ICD-10-CM | POA: Diagnosis not present

## 2021-05-28 DIAGNOSIS — R053 Chronic cough: Secondary | ICD-10-CM

## 2021-05-28 MED ORDER — METOPROLOL SUCCINATE ER 50 MG PO TB24: 50.0000 mg | ORAL_TABLET | Freq: Every day | ORAL | 1 refills | Status: DC

## 2021-05-28 MED ORDER — HYDROCHLOROTHIAZIDE 25 MG PO TABS: 25.0000 mg | ORAL_TABLET | Freq: Every morning | ORAL | 1 refills | Status: DC

## 2021-05-28 NOTE — Assessment & Plan Note (Addendum)
Blood pressures look good.  I still think it certainly possible that the ARB could be causing chronic cough.  We will discontinue losartan keep the metoprolol and the hydrochlorothiazide and we will try increasing the metoprolol to compensate for no longer being on the losartan.  Encouraged her to keep an eye on her blood pressures over the next couple weeks and let me know if it needs to be adjusted.  Did discuss that an ACE/ARB inhibitor cough can take a couple of months to resolve. ?

## 2021-05-28 NOTE — Progress Notes (Signed)
? ? ?Virtual Visit via Video Note ? ?I connected with Stephanie Cross on 05/28/21 at  1:00 PM EDT by a video enabled telemedicine application and verified that I am speaking with the correct person using two identifiers. ?  ?I discussed the limitations of evaluation and management by telemedicine and the availability of in person appointments. The patient expressed understanding and agreed to proceed. ? ?Patient location: at home ?Provider location: in office ? ?Subjective:   ? ?CC:   ?Chief Complaint  ?Patient presents with  ? Hypertension  ?  Follow up. Patient stated her BP has been great. Patient stated BP has been running around 117-121/60's. Patient denies dizziness, chest pains or palpitations.   ? ? ?HPI: ? ?Hypertension- Pt denies chest pain, SOB, dizziness, or heart palpitations.  Taking meds as directed w/o problems.  Denies medication side effects.  She has noticed that she has a daily cough.  She says it just seems to be random.  She denies any other symptoms.  Its been going on for quite a long time.  She wonders if it could be a medication side effect. ? ?Wakes up the nasal congestion.  She says during the day its not that bad and at night she is usually fine but does wake up with congestion she would like to have some allergy testing done if possible. ? ?Is very overdue for her mammogram and bone density. ? ? ?Past medical history, Surgical history, Family history not pertinant except as noted below, Social history, Allergies, and medications have been entered into the medical record, reviewed, and corrections made.  ? ? ?Objective:   ? ?General: Speaking clearly in complete sentences without any shortness of breath.  Alert and oriented x3.  Normal judgment. No apparent acute distress. ? ? ? ?Impression and Recommendations:   ? ?Problem List Items Addressed This Visit   ? ?  ? Cardiovascular and Mediastinum  ? ESSENTIAL HYPERTENSION, BENIGN  ?  Blood pressures look good.  I still think it certainly  possible that the ARB could be causing chronic cough.  We will discontinue losartan keep the metoprolol and the hydrochlorothiazide and we will try increasing the metoprolol to compensate for no longer being on the losartan.  Encouraged her to keep an eye on her blood pressures over the next couple weeks and let me know if it needs to be adjusted.  Did discuss that an ACE/ARB inhibitor cough can take a couple of months to resolve. ? ?  ?  ? Relevant Medications  ? metoprolol succinate (TOPROL-XL) 50 MG 24 hr tablet  ? hydrochlorothiazide (HYDRODIURIL) 25 MG tablet  ? Other Relevant Orders  ? Lipid Profile  ? BMP8+eGFR  ? Allergens w/Comp Rflx Area 2  ?  ? Genitourinary  ? Chronic kidney disease (CKD) stage G3b/A1, moderately decreased glomerular filtration rate (GFR) between 30-44 mL/min/1.73 square meter and albuminuria creatinine ratio less than 30 mg/g (HCC) - Primary  ?  Commend following renal function every 6 months. ? ?  ?  ? Relevant Medications  ? metoprolol succinate (TOPROL-XL) 50 MG 24 hr tablet  ? hydrochlorothiazide (HYDRODIURIL) 25 MG tablet  ? Other Relevant Orders  ? Lipid Profile  ? BMP8+eGFR  ? Allergens w/Comp Rflx Area 2  ?  ? Other  ? Chronic cough  ? ?Other Visit Diagnoses   ? ? Essential hypertension      ? Relevant Medications  ? metoprolol succinate (TOPROL-XL) 50 MG 24 hr tablet  ? hydrochlorothiazide (  HYDRODIURIL) 25 MG tablet  ? Seasonal allergies      ? Relevant Medications  ? metoprolol succinate (TOPROL-XL) 50 MG 24 hr tablet  ? hydrochlorothiazide (HYDRODIURIL) 25 MG tablet  ? Other Relevant Orders  ? Lipid Profile  ? BMP8+eGFR  ? Allergens w/Comp Rflx Area 2  ? Nasal congestion      ? Relevant Medications  ? metoprolol succinate (TOPROL-XL) 50 MG 24 hr tablet  ? hydrochlorothiazide (HYDRODIURIL) 25 MG tablet  ? Other Relevant Orders  ? Lipid Profile  ? BMP8+eGFR  ? Allergens w/Comp Rflx Area 2  ? ?  ? ?Chronic nasal congestion in the morning-most consistent with dust mite allergies.   But okay to get allergy testing for further work-up.  Could consider investment in a dust mite cover as well.  Also recommend a trial of a nasal steroid spray at bedtime to see if this helps as well. ? ? ?Did encourage her to schedule her next appointment in person as I have not seen her in person in almost 4 years.  So strongly encouraged her to go ahead and get her mammogram and her bone density test done. ? ?Orders Placed This Encounter  ?Procedures  ? Lipid Profile  ? BMP8+eGFR  ? Allergens w/Comp Rflx Area 2  ? ? ?Meds ordered this encounter  ?Medications  ? metoprolol succinate (TOPROL-XL) 50 MG 24 hr tablet  ?  Sig: Take 1 tablet (50 mg total) by mouth at bedtime. Take with or immediately following a meal.  ?  Dispense:  90 tablet  ?  Refill:  1  ? hydrochlorothiazide (HYDRODIURIL) 25 MG tablet  ?  Sig: Take 1 tablet (25 mg total) by mouth in the morning.  ?  Dispense:  90 tablet  ?  Refill:  1  ? ? ? ?I discussed the assessment and treatment plan with the patient. The patient was provided an opportunity to ask questions and all were answered. The patient agreed with the plan and demonstrated an understanding of the instructions. ?  ?The patient was advised to call back or seek an in-person evaluation if the symptoms worsen or if the condition fails to improve as anticipated. ? ? ?Beatrice Lecher, MD  ? ?

## 2021-05-28 NOTE — Assessment & Plan Note (Signed)
Commend following renal function every 6 months. ?

## 2021-06-02 NOTE — Progress Notes (Signed)
Hi Stephanie Cross, LDL cholesterol still little elevated at 123, similar to about 7 months ago.  Continue to work on [employer] and regular exercise.  Kidney function is stable at 1.2.  This is where you have been for about the last 5 years.  Allergy testing is still pending.

## 2021-06-05 LAB — ALLERGENS W/COMP RFLX AREA 2
Alternaria Alternata IgE: <0.1 kU/L
Aspergillus Fumigatus IgE: <0.1 kU/L
Bermuda Grass IgE: <0.1 kU/L
Cedar, Mountain IgE: <0.1 kU/L
Cladosporium Herbarum IgE: <0.1 kU/L
Cockroach, German IgE: <0.1 kU/L
Common Silver Birch IgE: <0.1 kU/L
Cottonwood IgE: <0.1 kU/L
D Farinae IgE: <0.1 kU/L
D Pteronyssinus IgE: <0.1 kU/L
E001-IgE Cat Dander: <0.1 kU/L
E005-IgE Dog Dander: <0.1 kU/L
Elm, American IgE: <0.1 kU/L
IgE (Immunoglobulin E), Serum: 8 IU/mL (ref 6–495)
Johnson Grass IgE: <0.1 kU/L
Maple/Box Elder IgE: <0.1 kU/L
Mouse Urine IgE: <0.1 kU/L
Oak, White IgE: <0.1 kU/L
Pecan, Hickory IgE: <0.1 kU/L
Penicillium Chrysogen IgE: <0.1 kU/L
Pigweed, Rough IgE: <0.1 kU/L
Ragweed, Short IgE: <0.1 kU/L
Sheep Sorrel IgE Qn: <0.1 kU/L
Timothy Grass IgE: <0.1 kU/L
White Mulberry IgE: <0.1 kU/L

## 2021-06-05 LAB — BMP8+EGFR
BUN/Creatinine Ratio: 18 (ref 12–28)
BUN: 22 mg/dL (ref 8–27)
CO2: 25 mmol/L (ref 20–29)
Calcium: 10.2 mg/dL (ref 8.7–10.3)
Chloride: 98 mmol/L (ref 96–106)
Creatinine, Ser: 1.22 mg/dL — ABNORMAL HIGH (ref 0.57–1.00)
Glucose: 97 mg/dL (ref 70–99)
Potassium: 4.2 mmol/L (ref 3.5–5.2)
Sodium: 138 mmol/L (ref 134–144)
eGFR: 48 mL/min/{1.73_m2} — ABNORMAL LOW (ref >59)

## 2021-06-05 LAB — LIPID PANEL
Chol/HDL Ratio: 2.6 ratio (ref 0.0–4.4)
Cholesterol, Total: 227 mg/dL — ABNORMAL HIGH (ref 100–199)
HDL: 87 mg/dL (ref >39)
LDL Chol Calc (NIH): 123 mg/dL — ABNORMAL HIGH (ref 0–99)
Triglycerides: 99 mg/dL (ref 0–149)
VLDL Cholesterol Cal: 17 mg/dL (ref 5–40)

## 2021-06-05 NOTE — Progress Notes (Signed)
HI Stephanie Cross, your allergy testing all came back normal with low levels.  We could always refer you to an allergist for more in depth testing if you would like.

## 2021-11-18 ENCOUNTER — Other Ambulatory Visit: Payer: Self-pay | Admitting: Family Medicine

## 2021-11-18 DIAGNOSIS — R0981 Nasal congestion: Secondary | ICD-10-CM

## 2021-11-18 DIAGNOSIS — I10 Essential (primary) hypertension: Secondary | ICD-10-CM

## 2021-11-18 DIAGNOSIS — N1832 Chronic kidney disease, stage 3b: Secondary | ICD-10-CM

## 2021-11-18 DIAGNOSIS — J302 Other seasonal allergic rhinitis: Secondary | ICD-10-CM

## 2022-02-13 ENCOUNTER — Other Ambulatory Visit: Payer: Self-pay | Admitting: Family Medicine

## 2022-02-13 DIAGNOSIS — J302 Other seasonal allergic rhinitis: Secondary | ICD-10-CM

## 2022-02-13 DIAGNOSIS — I10 Essential (primary) hypertension: Secondary | ICD-10-CM

## 2022-02-13 DIAGNOSIS — N1832 Chronic kidney disease, stage 3b: Secondary | ICD-10-CM

## 2022-02-13 DIAGNOSIS — R0981 Nasal congestion: Secondary | ICD-10-CM

## 2022-02-22 ENCOUNTER — Telehealth: Payer: Self-pay | Admitting: Family Medicine

## 2022-02-22 DIAGNOSIS — N1832 Chronic kidney disease, stage 3b: Secondary | ICD-10-CM

## 2022-02-22 DIAGNOSIS — R0981 Nasal congestion: Secondary | ICD-10-CM

## 2022-02-22 DIAGNOSIS — I10 Essential (primary) hypertension: Secondary | ICD-10-CM

## 2022-02-22 DIAGNOSIS — J302 Other seasonal allergic rhinitis: Secondary | ICD-10-CM

## 2022-02-22 NOTE — Telephone Encounter (Signed)
Called patient left voicemail to call office to schedule appointment for medication refill, thanks.  

## 2022-02-22 NOTE — Telephone Encounter (Signed)
Please contact the patient to schedule HTN follow-up with Dr. Madilyn Fireman. Sending 30 pill refill to hold. Thanks

## 2022-02-23 ENCOUNTER — Other Ambulatory Visit: Payer: Self-pay | Admitting: *Deleted

## 2022-02-23 DIAGNOSIS — E7800 Pure hypercholesterolemia, unspecified: Secondary | ICD-10-CM

## 2022-02-23 DIAGNOSIS — I10 Essential (primary) hypertension: Secondary | ICD-10-CM

## 2022-02-23 DIAGNOSIS — N1832 Chronic kidney disease, stage 3b: Secondary | ICD-10-CM

## 2022-03-08 ENCOUNTER — Encounter: Payer: Self-pay | Admitting: Family Medicine

## 2022-03-08 ENCOUNTER — Ambulatory Visit: Payer: 59 | Admitting: Family Medicine

## 2022-03-08 VITALS — BP 148/74 | HR 44 | Ht 68.0 in | Wt 155.0 lb

## 2022-03-08 DIAGNOSIS — R011 Cardiac murmur, unspecified: Secondary | ICD-10-CM

## 2022-03-08 DIAGNOSIS — N1832 Chronic kidney disease, stage 3b: Secondary | ICD-10-CM

## 2022-03-08 DIAGNOSIS — I10 Essential (primary) hypertension: Secondary | ICD-10-CM

## 2022-03-08 DIAGNOSIS — R001 Bradycardia, unspecified: Secondary | ICD-10-CM

## 2022-03-08 DIAGNOSIS — H00014 Hordeolum externum left upper eyelid: Secondary | ICD-10-CM

## 2022-03-08 NOTE — Assessment & Plan Note (Signed)
It was the first time of her he heard a heart murmur.  She says she has never been told she has a heart murmur and she is bradycardic today.  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  EF was normal at that time at 65 to 70% to maybe even a little hyperdynamic.  So would like to get that scheduled for further workup she is actually been feeling great she has not had any chest pain shortness of breath palpitations lightheadedness dizziness and she has been going to the gym 3 days/week.

## 2022-03-08 NOTE — Assessment & Plan Note (Signed)
Blood pressure is up a little bit today she says she normally gets about her blood pressure at home.

## 2022-03-08 NOTE — Assessment & Plan Note (Signed)
Due to recheck renal function.  Will get up-to-date labs.

## 2022-03-08 NOTE — Progress Notes (Signed)
Established Patient Office Visit  Subjective   Patient ID: Stephanie Cross, female    DOB: 1950/05/21  Age: 72 y.o. MRN: [id number]  Chief Complaint  Patient presents with   Hypertension    HPI  Hypertension- Pt denies chest pain, SOB, dizziness, or heart palpitations.  Taking meds as directed w/o problems.  Denies medication side effects.  He has been going to the gym 3 days per week.    F/U CKD 3 - No recent changes.   She is scheduled for oral surgery later this week.  Also been battling a stye on her left upper eyelid.  She says at 1 point it was much bigger and look like it was going to drain but it never did she has been doing warm compresses and applying a little over-the-counter antibiotic ointment.    ROS    Objective:     BP (!) 148/74 (BP Location: Right Arm, Patient Position: Sitting)   Pulse (!) 44   Ht 5' 8"$  (1.727 m)   Wt 155 lb 0.6 oz (70.3 kg)   SpO2 99%   BMI 23.57 kg/m    Physical Exam Vitals and nursing note reviewed.  Constitutional:      Appearance: She is well-developed.  HENT:     Head: Normocephalic and atraumatic.  Eyes:     Comments: Left upper eyelid is mildly swollen with some erythema.  No drainage in the corners.  Cardiovascular:     Rate and Rhythm: Bradycardia present. Rhythm irregular.     Heart sounds: Normal heart sounds.     Comments: 2/6 SEM Pulmonary:     Effort: Pulmonary effort is normal.     Breath sounds: Normal breath sounds.  Skin:    General: Skin is warm and dry.  Neurological:     Mental Status: She is alert and oriented to person, place, and time.  Psychiatric:        Behavior: Behavior normal.      No results found for any visits on 03/08/22.    The 10-year ASCVD risk score (Arnett DK, et al., 2019) is: 15.7%    Assessment & Plan:   Problem List Items Addressed This Visit       Cardiovascular and Mediastinum   Essential hypertension, benign - Primary    Blood pressure is up a little bit  today she says she normally gets about her blood pressure at home.        Genitourinary   Chronic kidney disease (CKD) stage G3b/A1, moderately decreased glomerular filtration rate (GFR) between 30-44 mL/min/1.73 square meter and albuminuria creatinine ratio less than 30 mg/g (HCC)    Due to recheck renal function.  Will get up-to-date labs.      Relevant Orders   BASIC METABOLIC PANEL WITH GFR   Urine Microalbumin w/creat. ratio     Other   Heart murmur    It was the first time of her he heard a heart murmur.  She says she has never been told she has a heart murmur and she is bradycardic today.  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  EF was normal at that time at 65 to 70% to maybe even a little hyperdynamic.  So would like to get that scheduled for further workup she is actually been feeling great she has not had any chest pain shortness of breath palpitations lightheadedness  dizziness and she has been going to the gym 3 days/week.      Relevant Orders   ECHOCARDIOGRAM COMPLETE   Other Visit Diagnoses     Bradycardia       Hordeolum externum of left upper eyelid           Stye on left upper eyelid.  It looks like it is been gradually getting a little better.  Continue with the warm compresses and hopefully it should resolve if not let us know.  Return in about 6 months (around 09/06/2022) for Hypertension.    Beatrice Lecher, MD

## 2022-03-31 ENCOUNTER — Ambulatory Visit (INDEPENDENT_AMBULATORY_CARE_PROVIDER_SITE_OTHER): Payer: 59

## 2022-03-31 DIAGNOSIS — R011 Cardiac murmur, unspecified: Secondary | ICD-10-CM

## 2022-03-31 LAB — ECHOCARDIOGRAM COMPLETE
Area-P 1/2: 2.76 cm2
P 1/2 time: 577 msec
S' Lateral: 3.47 cm

## 2022-04-01 ENCOUNTER — Encounter: Payer: Self-pay | Admitting: Family Medicine

## 2022-04-01 DIAGNOSIS — I341 Nonrheumatic mitral (valve) prolapse: Secondary | ICD-10-CM | POA: Insufficient documentation

## 2022-04-01 NOTE — Progress Notes (Signed)
Hi Stephanie Cross, the cardiogram of your heart shows normal pumping function with an ejection fraction of 60 to 65%.  That looks great.  The wall motion also looks normal.  So no sign of damage or injury.  They did note the mitral valve prolapse of both leaflets of the mitral valve.  No sign of narrowing or difficulty with backflow on the valve so that is reassuring.  A little bit of calcification around the aortic valve but no evidence of narrowing which is good.  Have you ever been told that you have mitral valve prolapse before?  If not and this is new I would like to consider referral to cardiology just to make sure that we do not need to do anything different going forward or monitor this.  There are people who live with this for decades and do not have any problems or complications.  But I would just want their assistance to make sure that we are doing what we need to do.  If you have had a known history of this then we will just continue to monitor.

## 2022-04-06 ENCOUNTER — Other Ambulatory Visit: Payer: Self-pay | Admitting: *Deleted

## 2022-04-06 DIAGNOSIS — R011 Cardiac murmur, unspecified: Secondary | ICD-10-CM

## 2022-04-06 DIAGNOSIS — I341 Nonrheumatic mitral (valve) prolapse: Secondary | ICD-10-CM

## 2022-05-10 NOTE — Progress Notes (Signed)
Cardiology Office Note:    Date:  05/11/2022   ID:  Stephanie Cross, DOB 1950/06/13, MRN [id number]  PCP:  Agapito Games, MD  Cardiologist:  Jodelle Red, MD  Referring MD: Agapito Games, *   CC: new patient consultation for hypertension  History of Present Illness:    Stephanie Cross is a 72 y.o. female with a hx of hypertension who is seen as a new consult at the request of Agapito Games, * for the evaluation and management of hypertension. Notes from Dr. Linford Arnold reviewed.  Today, she is overall well. She is accompanied by her husband. No prior known history of heart disease.  She followed up with Dr. Linford Arnold on 03/08/2022 and was told she has a heart murmur. She states her blood pressure was high during that visit, which she attributed to white coat syndrome, a latte from [employer], and having worked out that morning. She has been managing her hypertension for about 15 years.   She has her cholesterol and kidney levels monitored by her PCP.Marland Kitchen  We discussed the results of her latest Echo.   She denies any palpitations, chest pain, shortness of breath, or peripheral edema. No lightheadedness, headaches, syncope, orthopnea, or PND.  Past Medical History:  Diagnosis Date   Allergy    Anxiety    Fungal toenail infection    being treated for 2 weeks ago/03/2012   Hypertension    Menopause syndrome     Past Surgical History:  Procedure Laterality Date   bone spur     DILATION AND CURETTAGE OF UTERUS     EXPLORATORY LAPAROTOMY     GLAUCOMA SURGERY Bilateral 2019   miscarriages      2 times   WISDOM TOOTH EXTRACTION      Current Medications: Current Outpatient Medications on File Prior to Visit  Medication Sig   Acetaminophen (TYLENOL PO) Take by mouth as needed.   hydrochlorothiazide (HYDRODIURIL) 25 MG tablet TAKE 1 TABLET (25 MG TOTAL) BY MOUTH IN THE MORNING   loratadine (CLARITIN) 10 MG tablet Take 10 mg by mouth daily as needed for  allergies.   metoprolol succinate (TOPROL-XL) 50 MG 24 hr tablet TAKE 1 TABLET BY MOUTH EVERY DAY   Multiple Vitamin (MULTIVITAMIN) tablet Take by mouth. MultiVites-Take 2 daily   No current facility-administered medications on file prior to visit.     Allergies:   Augmentin [amoxicillin-pot clavulanate] and Lisinopril   Social History   Tobacco Use   Smoking status: Never   Smokeless tobacco: Never  Substance Use Topics   Alcohol use: Yes    Alcohol/week: 4.0 - 6.0 standard drinks of alcohol    Types: 4 - 6 Glasses of wine per week    Comment: occasiona;   Drug use: No    Family History: family history includes COPD in her mother; Colon cancer in her brother; Lung cancer in her father.  ROS:   Please see the history of present illness.  Additional pertinent ROS: Constitutional: Negative for chills, fever, night sweats, unintentional weight loss  HENT: Negative for ear pain and hearing loss.   Eyes: Negative for loss of vision and eye pain.  Respiratory: Negative for cough, sputum, wheezing.   Cardiovascular: See HPI. Gastrointestinal: Negative for abdominal pain, melena, and hematochezia.  Genitourinary: Negative for dysuria and hematuria.  Musculoskeletal: Negative for falls and myalgias.  Skin: Negative for itching and rash.  Neurological: Negative for focal weakness, focal sensory changes and loss of consciousness.  Endo/Heme/Allergies: Does not bruise/bleed easily.     EKGs/Labs/Other Studies Reviewed:    The following studies were reviewed today:  Echo 03/31/2022:  IMPRESSIONS   1. Left ventricular ejection fraction, by estimation, is 60 to 65%. The  left ventricle has normal function. The left ventricle has no regional  wall motion abnormalities. Left ventricular diastolic parameters were  normal.   2. Right ventricular systolic function is normal. The right ventricular  size is normal.   3. The mitral valve is abnormal. Trivial mitral valve regurgitation. No   evidence of mitral stenosis. There is mild late systolic prolapse of both  leaflets of the mitral valve.   4. The aortic valve is tricuspid. There is mild calcification of the  aortic valve. Aortic valve regurgitation is trivial. Aortic valve  sclerosis is present, with no evidence of aortic valve stenosis.   5. The inferior vena cava is normal in size with greater than 50%  respiratory variability, suggesting right atrial pressure of 3 mmHg.   Comparison(s): Prior images unable to be directly viewed, comparison made by report only. Mild MVP on current study, not noted on prior study.    EKG:  EKG is personally reviewed.   05/11/2022: Sinus rhythm at 68 bpm with LAFB  Recent Labs: 06/01/2021: BUN 22; Creatinine, Ser 1.22; Potassium 4.2; Sodium 138  Recent Lipid Panel    Component Value Date/Time   CHOL 227 (H) 06/01/2021 0853   TRIG 99 06/01/2021 0853   HDL 87 06/01/2021 0853   CHOLHDL 2.6 06/01/2021 0853   CHOLHDL 3.5 07/17/2017 1117   VLDL 20 05/02/2016 0812   LDLCALC 123 (H) 06/01/2021 0853   LDLCALC 148 (H) 07/17/2017 1117    Physical Exam:    VS:  BP 130/86 (BP Location: Left Arm, Patient Position: Sitting, Cuff Size: Normal)   Pulse 68   Ht 5\' 8"  (1.727 m)   Wt 156 lb (70.8 kg)   BMI 23.72 kg/m     Wt Readings from Last 3 Encounters:  05/11/22 156 lb (70.8 kg)  03/08/22 155 lb 0.6 oz (70.3 kg)  05/28/21 157 lb (71.2 kg)    GEN: Well nourished, well developed in no acute distress HEENT: Normal, moist mucous membranes NECK: No JVD CARDIAC: regular rhythm, normal S1 and S2, no rubs or gallops. 1/6 murmur. VASCULAR: Radial and DP pulses 2+ bilaterally. No carotid bruits RESPIRATORY:  Clear to auscultation without rales, wheezing or rhonchi  ABDOMEN: Soft, non-tender, non-distended MUSCULOSKELETAL:  Ambulates independently SKIN: Warm and dry, no edema NEUROLOGIC:  Alert and oriented x 3. No focal neuro deficits noted. PSYCHIATRIC:  Normal affect    ASSESSMENT:     1. MVP (mitral valve prolapse)   2. Essential hypertension, benign   3. Encounter to discuss test results   4. Cardiac risk counseling   5. Counseling on health promotion and disease prevention    PLAN:   Hypertension -improved on recheck -continue HCTZ -if BP rises, would taper off metoprolol and change to either ARB or amlodipine. Had cough on lisinopril.  Murmur Mild mitral valve prolapse -reviewed echo at length today -asymptomatic  Cardiac risk counseling and prevention recommendations: -recommend heart healthy/Mediterranean diet, with whole grains, fruits, vegetable, fish, lean meats, nuts, and olive oil. Limit salt. -recommend moderate walking, 3-5 times/week for 30-50 minutes each session. Aim for at least 150 minutes.week. Goal should be pace of 3 miles/hours, or walking 1.5 miles in 30 minutes -recommend avoidance of tobacco products. Avoid excess alcohol. -ASCVD risk score:  The 10-year ASCVD risk score (Arnett DK, et al., 2019) is: 12.4%   Values used to calculate the score:     Age: 35 years     Sex: Female     Is Non-Hispanic African American: No     Diabetic: No     Tobacco smoker: No     Systolic Blood Pressure: 130 mmHg     Is BP treated: Yes     HDL Cholesterol: 87 mg/dL     Total Cholesterol: 227 mg/dL    Plan for follow up: 2 years, or sooner if needed   Jodelle Red, MD, PhD, [facility]  [facility]    Heart & Vascular at [facility] at [facility] [address], [address] [place], [place] [address] [phone]   Medication Adjustments/Labs and Tests Ordered: Current medicines are reviewed at length with the patient today.  Concerns regarding medicines are outlined above.  Orders Placed This Encounter  Procedures   EKG 12-Lead   No orders of the defined types were placed in this encounter.  Patient Instructions  Medication Instructions:  Your physician recommends that you continue on  your current medications as directed. Please refer to the Current Medication list given to you today.  *If you need a refill on your cardiac medications before your next appointment, please call your pharmacy*  Lab Work: NONE  Testing/Procedures: NONE  Follow-Up: At [facility], you and your health needs are our priority.  As part of our continuing mission to provide you with exceptional heart care, we have created designated Provider Care Teams.  These Care Teams include your primary Cardiologist (physician) and Advanced Practice Providers (APPs -  Physician Assistants and Nurse Practitioners) who all work together to provide you with the care you need, when you need it.  We recommend signing up for the patient portal called "MyChart".  Sign up information is provided on this After Visit Summary.  MyChart is used to connect with patients for Virtual Visits (Telemedicine).  Patients are able to view lab/test results, encounter notes, upcoming appointments, etc.  Non-urgent messages can be sent to your provider as well.   To learn more about what you can do with MyChart, go to [email or web address].    Your next appointment:   2 years   The format for your next appointment:   In Person  Provider:   Jodelle Red, MD       I,Rachel Rivera,acting as a scribe for Jodelle Red, MD.,have documented all relevant documentation on the behalf of Jodelle Red, MD,as directed by  Jodelle Red, MD while in the presence of Jodelle Red, MD.  I, Jodelle Red, MD, have reviewed all documentation for this visit. The documentation on 07/06/22 for the exam, diagnosis, procedures, and orders are all accurate and complete.   Signed, Jodelle Red, MD PhD 05/11/2022     [facility]

## 2022-05-11 ENCOUNTER — Ambulatory Visit (HOSPITAL_BASED_OUTPATIENT_CLINIC_OR_DEPARTMENT_OTHER): Payer: 59 | Admitting: Cardiology

## 2022-05-11 ENCOUNTER — Encounter (HOSPITAL_BASED_OUTPATIENT_CLINIC_OR_DEPARTMENT_OTHER): Payer: Self-pay | Admitting: Cardiology

## 2022-05-11 VITALS — BP 130/86 | HR 68 | Ht 68.0 in | Wt 156.0 lb

## 2022-05-11 DIAGNOSIS — Z7189 Other specified counseling: Secondary | ICD-10-CM

## 2022-05-11 DIAGNOSIS — I10 Essential (primary) hypertension: Secondary | ICD-10-CM

## 2022-05-11 DIAGNOSIS — Z712 Person consulting for explanation of examination or test findings: Secondary | ICD-10-CM

## 2022-05-11 DIAGNOSIS — I341 Nonrheumatic mitral (valve) prolapse: Secondary | ICD-10-CM

## 2022-05-11 NOTE — Patient Instructions (Signed)
Medication Instructions:  Your physician recommends that you continue on your current medications as directed. Please refer to the Current Medication list given to you today.  *If you need a refill on your cardiac medications before your next appointment, please call your pharmacy*  Lab Work: NONE  Testing/Procedures: NONE  Follow-Up: At [facility], you and your health needs are our priority.  As part of our continuing mission to provide you with exceptional heart care, we have created designated Provider Care Teams.  These Care Teams include your primary Cardiologist (physician) and Advanced Practice Providers (APPs -  Physician Assistants and Nurse Practitioners) who all work together to provide you with the care you need, when you need it.  We recommend signing up for the patient portal called "MyChart".  Sign up information is provided on this After Visit Summary.  MyChart is used to connect with patients for Virtual Visits (Telemedicine).  Patients are able to view lab/test results, encounter notes, upcoming appointments, etc.  Non-urgent messages can be sent to your provider as well.   To learn more about what you can do with MyChart, go to [email or web address].    Your next appointment:   2 years   The format for your next appointment:   In Person  Provider:   Bridgette Christopher, MD     

## 2022-05-20 ENCOUNTER — Other Ambulatory Visit: Payer: Self-pay | Admitting: Family Medicine

## 2022-05-20 DIAGNOSIS — R0981 Nasal congestion: Secondary | ICD-10-CM

## 2022-05-20 DIAGNOSIS — J302 Other seasonal allergic rhinitis: Secondary | ICD-10-CM

## 2022-05-20 DIAGNOSIS — N1832 Chronic kidney disease, stage 3b: Secondary | ICD-10-CM

## 2022-05-20 DIAGNOSIS — I10 Essential (primary) hypertension: Secondary | ICD-10-CM

## 2022-07-06 ENCOUNTER — Encounter (HOSPITAL_BASED_OUTPATIENT_CLINIC_OR_DEPARTMENT_OTHER): Payer: Self-pay | Admitting: Cardiology

## 2022-08-05 ENCOUNTER — Other Ambulatory Visit: Payer: Self-pay | Admitting: Family Medicine

## 2022-08-05 DIAGNOSIS — N1832 Chronic kidney disease, stage 3b: Secondary | ICD-10-CM

## 2022-08-05 DIAGNOSIS — I10 Essential (primary) hypertension: Secondary | ICD-10-CM

## 2022-08-05 DIAGNOSIS — J302 Other seasonal allergic rhinitis: Secondary | ICD-10-CM

## 2022-08-05 DIAGNOSIS — R0981 Nasal congestion: Secondary | ICD-10-CM

## 2022-10-30 ENCOUNTER — Other Ambulatory Visit: Payer: Self-pay | Admitting: Family Medicine

## 2022-10-30 DIAGNOSIS — N1832 Chronic kidney disease, stage 3b: Secondary | ICD-10-CM

## 2022-10-30 DIAGNOSIS — I10 Essential (primary) hypertension: Secondary | ICD-10-CM

## 2022-10-30 DIAGNOSIS — J302 Other seasonal allergic rhinitis: Secondary | ICD-10-CM

## 2022-10-30 DIAGNOSIS — R0981 Nasal congestion: Secondary | ICD-10-CM

## 2022-11-02 ENCOUNTER — Other Ambulatory Visit: Payer: Self-pay | Admitting: Family Medicine

## 2022-11-02 DIAGNOSIS — R0981 Nasal congestion: Secondary | ICD-10-CM

## 2022-11-02 DIAGNOSIS — N1832 Chronic kidney disease, stage 3b: Secondary | ICD-10-CM

## 2022-11-02 DIAGNOSIS — J302 Other seasonal allergic rhinitis: Secondary | ICD-10-CM

## 2022-11-02 DIAGNOSIS — I10 Essential (primary) hypertension: Secondary | ICD-10-CM

## 2022-11-04 NOTE — Telephone Encounter (Signed)
Please call pt and advise her that she is overdue for f/u on bp and will need labs done for medication refills. Thanks.

## 2022-11-04 NOTE — Telephone Encounter (Signed)
Tried calling patient no answer phone just rang no voicemail unable to contact patient

## 2022-11-27 ENCOUNTER — Other Ambulatory Visit: Payer: Self-pay | Admitting: Family Medicine

## 2022-11-27 DIAGNOSIS — J302 Other seasonal allergic rhinitis: Secondary | ICD-10-CM

## 2022-11-27 DIAGNOSIS — R0981 Nasal congestion: Secondary | ICD-10-CM

## 2022-11-27 DIAGNOSIS — N1832 Chronic kidney disease, stage 3b: Secondary | ICD-10-CM

## 2022-11-27 DIAGNOSIS — I10 Essential (primary) hypertension: Secondary | ICD-10-CM

## 2023-01-27 ENCOUNTER — Other Ambulatory Visit: Payer: Self-pay | Admitting: Family Medicine

## 2023-01-27 DIAGNOSIS — R0981 Nasal congestion: Secondary | ICD-10-CM

## 2023-01-27 DIAGNOSIS — I10 Essential (primary) hypertension: Secondary | ICD-10-CM

## 2023-01-27 DIAGNOSIS — N1832 Chronic kidney disease, stage 3b: Secondary | ICD-10-CM

## 2023-01-27 DIAGNOSIS — J302 Other seasonal allergic rhinitis: Secondary | ICD-10-CM

## 2023-04-22 ENCOUNTER — Other Ambulatory Visit: Payer: Self-pay | Admitting: Family Medicine

## 2023-04-22 DIAGNOSIS — N1832 Chronic kidney disease, stage 3b: Secondary | ICD-10-CM

## 2023-04-22 DIAGNOSIS — J302 Other seasonal allergic rhinitis: Secondary | ICD-10-CM

## 2023-04-22 DIAGNOSIS — R0981 Nasal congestion: Secondary | ICD-10-CM

## 2023-04-22 DIAGNOSIS — I10 Essential (primary) hypertension: Secondary | ICD-10-CM

## 2023-05-19 ENCOUNTER — Other Ambulatory Visit: Payer: Self-pay | Admitting: Family Medicine

## 2023-05-19 DIAGNOSIS — I10 Essential (primary) hypertension: Secondary | ICD-10-CM

## 2023-05-19 DIAGNOSIS — J302 Other seasonal allergic rhinitis: Secondary | ICD-10-CM

## 2023-05-19 DIAGNOSIS — R0981 Nasal congestion: Secondary | ICD-10-CM

## 2023-05-19 DIAGNOSIS — N1832 Chronic kidney disease, stage 3b: Secondary | ICD-10-CM

## 2023-05-19 NOTE — Telephone Encounter (Signed)
 Called pt, lvm

## 2023-05-19 NOTE — Telephone Encounter (Signed)
 Please call pt and have her schedule a f/u appt for bp and labs she is overdue. thanks

## 2023-05-24 ENCOUNTER — Other Ambulatory Visit: Payer: Self-pay | Admitting: Family Medicine

## 2023-05-24 DIAGNOSIS — N1832 Chronic kidney disease, stage 3b: Secondary | ICD-10-CM

## 2023-05-24 DIAGNOSIS — I10 Essential (primary) hypertension: Secondary | ICD-10-CM

## 2023-05-24 DIAGNOSIS — J302 Other seasonal allergic rhinitis: Secondary | ICD-10-CM

## 2023-05-24 DIAGNOSIS — R0981 Nasal congestion: Secondary | ICD-10-CM

## 2023-05-24 NOTE — Telephone Encounter (Signed)
 Called patient left vm to call back schedule an office visit for BP and labs

## 2023-05-24 NOTE — Telephone Encounter (Signed)
 Please call pt she is overdue for OV for BP and labs. Thanks.

## 2023-08-20 ENCOUNTER — Other Ambulatory Visit: Payer: Self-pay | Admitting: Family Medicine

## 2023-08-20 DIAGNOSIS — N1832 Chronic kidney disease, stage 3b: Secondary | ICD-10-CM

## 2023-08-20 DIAGNOSIS — I10 Essential (primary) hypertension: Secondary | ICD-10-CM

## 2023-08-20 DIAGNOSIS — J302 Other seasonal allergic rhinitis: Secondary | ICD-10-CM

## 2023-08-20 DIAGNOSIS — R0981 Nasal congestion: Secondary | ICD-10-CM

## 2023-08-22 ENCOUNTER — Other Ambulatory Visit: Payer: Self-pay | Admitting: Family Medicine

## 2023-08-22 DIAGNOSIS — J302 Other seasonal allergic rhinitis: Secondary | ICD-10-CM

## 2023-08-22 DIAGNOSIS — I10 Essential (primary) hypertension: Secondary | ICD-10-CM

## 2023-08-22 DIAGNOSIS — R0981 Nasal congestion: Secondary | ICD-10-CM

## 2023-08-22 DIAGNOSIS — N1832 Chronic kidney disease, stage 3b: Secondary | ICD-10-CM

## 2023-09-28 ENCOUNTER — Encounter: Payer: Self-pay | Admitting: Family Medicine

## 2023-09-28 ENCOUNTER — Ambulatory Visit: Admitting: Family Medicine

## 2023-09-28 VITALS — BP 133/72 | HR 70 | Ht 68.0 in | Wt 154.6 lb

## 2023-09-28 DIAGNOSIS — N1832 Chronic kidney disease, stage 3b: Secondary | ICD-10-CM

## 2023-09-28 DIAGNOSIS — S46812A Strain of other muscles, fascia and tendons at shoulder and upper arm level, left arm, initial encounter: Secondary | ICD-10-CM

## 2023-09-28 DIAGNOSIS — I10 Essential (primary) hypertension: Secondary | ICD-10-CM

## 2023-09-28 DIAGNOSIS — K2100 Gastro-esophageal reflux disease with esophagitis, without bleeding: Secondary | ICD-10-CM

## 2023-09-28 DIAGNOSIS — J4520 Mild intermittent asthma, uncomplicated: Secondary | ICD-10-CM

## 2023-09-28 MED ORDER — BUDESONIDE-FORMOTEROL FUMARATE 80-4.5 MCG/ACT IN AERO: 2.0000 | INHALATION_SPRAY | Freq: Two times a day (BID) | RESPIRATORY_TRACT | 3 refills | Status: AC | PRN

## 2023-09-28 NOTE — Assessment & Plan Note (Signed)
BP is oK today

## 2023-09-28 NOTE — Progress Notes (Signed)
 Established Patient Office Visit  Subjective  Patient ID: MYLENA SEDBERRY, female    DOB: Nov 23, 1950  Age: 73 y.o. MRN: [id number]  Chief Complaint  Patient presents with   Hypertension    HPI  Hypertension- Pt denies chest pain, SOB, dizziness, or heart palpitations.  Taking meds as directed w/o problems.  Denies medication side effects.    Also having  some asthma triggering with cold air like walking in cold store or ewather changes. Doesn't have an albuterol  inhaler  C/o of left post shouldre pain - Not sure what has triggerd it. Does work with a Oceanographer from time to time.      ROS    Objective:     BP 133/72   Pulse 70   Ht 5' 8 (1.727 m)   Wt 154 lb 9.6 oz (70.1 kg)   SpO2 98%   BMI 23.51 kg/m    Physical Exam Vitals and nursing note reviewed.  Constitutional:      Appearance: Normal appearance.  HENT:     Head: Normocephalic and atraumatic.  Eyes:     Conjunctiva/sclera: Conjunctivae normal.  Cardiovascular:     Rate and Rhythm: Normal rate and regular rhythm.  Pulmonary:     Effort: Pulmonary effort is normal.     Breath sounds: Normal breath sounds.  Abdominal:     Tenderness: There is abdominal tenderness.  Musculoskeletal:     Comments: Left shoulder with NROM. Tender over the top edge of the scapula. The left scapula is more prominent that the right indicating some possible scoliosis.  Skin:    General: Skin is warm and dry.  Neurological:     Mental Status: She is alert.  Psychiatric:        Mood and Affect: Mood normal.      No results found for any visits on 09/28/23.    The 10-year ASCVD risk score (Arnett DK, et al., 2019) is: 16.3%    Assessment & Plan:   Problem List Items Addressed This Visit       Cardiovascular and Mediastinum   Essential hypertension, benign - Primary   BP is oK today.        Relevant Orders   CMP14+EGFR   Lipid panel   CBC   Urine Microalbumin w/creat. ratio     Respiratory   Asthma    Discussed changes to guildelinsa nd recommendation. Will send over Symbicort  to use PRN.        Relevant Medications   budesonide -formoterol  (SYMBICORT ) 80-4.5 MCG/ACT inhaler   Other Relevant Orders   Urine Microalbumin w/creat. ratio     Digestive   Gastroesophageal reflux disease with esophagitis   Occ reflux with certain meals and working in yard after eating.  Ok to use TUMS prn and ok to use PPI for several weeks at a time if needed.         Genitourinary   Chronic kidney disease (CKD) stage G3b/A1, moderately decreased glomerular filtration rate (GFR) between 30-44 mL/min/1.73 square meter and albuminuria creatinine ratio less than 30 mg/g (HCC)   Will get updated labs and urine microalubmin.       Relevant Orders   CMP14+EGFR   Lipid panel   CBC   Urine Microalbumin w/creat. ratio   Other Visit Diagnoses       Strain of left trapezius muscle, initial encounter          Declined colon cancer and breast cancer screening.    Left upper  trap pain - work on home physical therapy.  HO with exercises given. If not improving consider more formal PT. Continue heat/ice and rub for relief.    Return in about 6 months (around 03/27/2024) for bp.    Dorothyann Byars, MD

## 2023-09-28 NOTE — Assessment & Plan Note (Signed)
 Discussed changes to guildelinsa nd recommendation. Will send over Symbicort  to use PRN.

## 2023-09-28 NOTE — Assessment & Plan Note (Signed)
 Occ reflux with certain meals and working in yard after eating.  Ok to use TUMS prn and ok to use PPI for several weeks at a time if needed.

## 2023-09-28 NOTE — Assessment & Plan Note (Signed)
 Will get updated labs and urine microalubmin.

## 2023-09-29 ENCOUNTER — Ambulatory Visit: Payer: Self-pay | Admitting: Family Medicine

## 2023-09-29 LAB — CMP14+EGFR
ALT: 15 IU/L (ref 0–32)
AST: 17 IU/L (ref 0–40)
Albumin: 4.6 g/dL (ref 3.8–4.8)
Alkaline Phosphatase: 62 IU/L (ref 44–121)
BUN/Creatinine Ratio: 21 (ref 12–28)
BUN: 23 mg/dL (ref 8–27)
Bilirubin Total: 0.7 mg/dL (ref 0.0–1.2)
CO2: 22 mmol/L (ref 20–29)
Calcium: 9.9 mg/dL (ref 8.7–10.3)
Chloride: 93 mmol/L — ABNORMAL LOW (ref 96–106)
Creatinine, Ser: 1.07 mg/dL — ABNORMAL HIGH (ref 0.57–1.00)
Globulin, Total: 2.3 g/dL (ref 1.5–4.5)
Glucose: 89 mg/dL (ref 70–99)
Potassium: 3.4 mmol/L — ABNORMAL LOW (ref 3.5–5.2)
Sodium: 134 mmol/L (ref 134–144)
Total Protein: 6.9 g/dL (ref 6.0–8.5)
eGFR: 55 mL/min/1.73 — ABNORMAL LOW (ref >59)

## 2023-09-29 LAB — LIPID PANEL
Chol/HDL Ratio: 2.9 ratio (ref 0.0–4.4)
Cholesterol, Total: 242 mg/dL — ABNORMAL HIGH (ref 100–199)
HDL: 83 mg/dL (ref >39)
LDL Chol Calc (NIH): 127 mg/dL — ABNORMAL HIGH (ref 0–99)
Triglycerides: 183 mg/dL — ABNORMAL HIGH (ref 0–149)
VLDL Cholesterol Cal: 32 mg/dL (ref 5–40)

## 2023-09-29 LAB — MICROALBUMIN / CREATININE URINE RATIO
Creatinine, Urine: 41.5 mg/dL
Microalb/Creat Ratio: 30 mg/g{creat} — ABNORMAL HIGH (ref 0–29)
Microalbumin, Urine: 12.5 ug/mL

## 2023-09-29 LAB — CBC
Hematocrit: 42.1 % (ref 34.0–46.6)
Hemoglobin: 14.2 g/dL (ref 11.1–15.9)
MCH: 30.8 pg (ref 26.6–33.0)
MCHC: 33.7 g/dL (ref 31.5–35.7)
MCV: 91 fL (ref 79–97)
Platelets: 181 x10E3/uL (ref 150–450)
RBC: 4.61 x10E6/uL (ref 3.77–5.28)
RDW: 12.9 % (ref 11.7–15.4)
WBC: 6.3 x10E3/uL (ref 3.4–10.8)

## 2023-09-29 NOTE — Progress Notes (Signed)
 Hi Stephanie Cross, kidney function actually looks better this time compared to 2 years ago normal you run around 1.2 and this time it was around 1.0 which is great.  Your potassium was a little on the low end.  At 3.4 nothing major but I do want to recheck this in about 2 to 3 weeks to see if it is staying low if it was just off that day.  Liver enzymes are normal.  LDL and triglycerides are elevated.  Just encourage you to continue to work on healthy Mediterranean diet and shooting for 30 minutes of moderate intensity exercise 5 days/week.  You did show just some slight increase in protein in your urine.  So also wanted keep an eye on that and plan to recheck again in 6 months.  Blood count looks good no sign of anemia or infection.

## 2023-11-17 ENCOUNTER — Other Ambulatory Visit: Payer: Self-pay | Admitting: Family Medicine

## 2023-11-17 DIAGNOSIS — R0981 Nasal congestion: Secondary | ICD-10-CM

## 2023-11-17 DIAGNOSIS — N1832 Chronic kidney disease, stage 3b: Secondary | ICD-10-CM

## 2023-11-17 DIAGNOSIS — I10 Essential (primary) hypertension: Secondary | ICD-10-CM

## 2023-11-17 DIAGNOSIS — J302 Other seasonal allergic rhinitis: Secondary | ICD-10-CM

## 2024-02-14 ENCOUNTER — Other Ambulatory Visit: Payer: Self-pay | Admitting: Family Medicine

## 2024-02-14 DIAGNOSIS — I10 Essential (primary) hypertension: Secondary | ICD-10-CM

## 2024-02-14 DIAGNOSIS — R0981 Nasal congestion: Secondary | ICD-10-CM

## 2024-02-14 DIAGNOSIS — J302 Other seasonal allergic rhinitis: Secondary | ICD-10-CM

## 2024-02-14 DIAGNOSIS — N1832 Chronic kidney disease, stage 3b: Secondary | ICD-10-CM

## 2024-02-15 ENCOUNTER — Other Ambulatory Visit: Payer: Self-pay | Admitting: Family Medicine

## 2024-02-15 DIAGNOSIS — J302 Other seasonal allergic rhinitis: Secondary | ICD-10-CM

## 2024-02-15 DIAGNOSIS — R0981 Nasal congestion: Secondary | ICD-10-CM

## 2024-02-15 DIAGNOSIS — N1832 Chronic kidney disease, stage 3b: Secondary | ICD-10-CM

## 2024-02-15 DIAGNOSIS — I10 Essential (primary) hypertension: Secondary | ICD-10-CM
